# Patient Record
Sex: Female | Born: 1980 | Race: Black or African American | Hispanic: No | Marital: Single | Smoking: Never smoker
Health system: Southern US, Community
[De-identification: ages and names within clinical notes are randomized; demographics above are authoritative.]

## PROBLEM LIST (undated history)

## (undated) DIAGNOSIS — E669 Obesity, unspecified: Secondary | ICD-10-CM

## (undated) HISTORY — PX: CHOLECYSTECTOMY: SHX55

## (undated) HISTORY — PX: APPENDECTOMY: SHX54

## (undated) HISTORY — DX: Obesity, unspecified: E66.9

---

## 1998-12-18 ENCOUNTER — Ambulatory Visit (HOSPITAL_BASED_OUTPATIENT_CLINIC_OR_DEPARTMENT_OTHER): Admission: RE | Admit: 1998-12-18 | Discharge: 1998-12-18 | Payer: Self-pay | Admitting: General Surgery

## 1998-12-18 ENCOUNTER — Encounter (INDEPENDENT_AMBULATORY_CARE_PROVIDER_SITE_OTHER): Payer: Self-pay | Admitting: *Deleted

## 2004-06-10 ENCOUNTER — Other Ambulatory Visit: Admission: RE | Admit: 2004-06-10 | Discharge: 2004-06-10 | Payer: Self-pay | Admitting: Obstetrics and Gynecology

## 2004-11-02 ENCOUNTER — Inpatient Hospital Stay (HOSPITAL_COMMUNITY): Admission: AD | Admit: 2004-11-02 | Discharge: 2004-11-05 | Payer: Self-pay | Admitting: Obstetrics and Gynecology

## 2007-03-30 ENCOUNTER — Ambulatory Visit: Payer: Self-pay | Admitting: Internal Medicine

## 2007-04-05 ENCOUNTER — Ambulatory Visit: Payer: Self-pay | Admitting: *Deleted

## 2007-05-25 ENCOUNTER — Ambulatory Visit: Payer: Self-pay | Admitting: Internal Medicine

## 2007-05-28 ENCOUNTER — Ambulatory Visit: Payer: Self-pay | Admitting: Internal Medicine

## 2007-05-28 LAB — CONVERTED CEMR LAB: Preg, Serum: NEGATIVE

## 2007-05-29 ENCOUNTER — Ambulatory Visit: Payer: Self-pay | Admitting: Internal Medicine

## 2007-08-20 ENCOUNTER — Ambulatory Visit: Payer: Self-pay | Admitting: Internal Medicine

## 2007-11-12 ENCOUNTER — Ambulatory Visit: Payer: Self-pay | Admitting: Internal Medicine

## 2007-12-23 ENCOUNTER — Emergency Department (HOSPITAL_COMMUNITY): Admission: EM | Admit: 2007-12-23 | Discharge: 2007-12-23 | Payer: Self-pay | Admitting: Emergency Medicine

## 2008-02-04 ENCOUNTER — Ambulatory Visit: Payer: Self-pay | Admitting: Internal Medicine

## 2008-03-21 ENCOUNTER — Ambulatory Visit: Payer: Self-pay | Admitting: Internal Medicine

## 2008-03-21 ENCOUNTER — Encounter (INDEPENDENT_AMBULATORY_CARE_PROVIDER_SITE_OTHER): Payer: Self-pay | Admitting: Internal Medicine

## 2008-03-21 LAB — CONVERTED CEMR LAB
ALT: 32 units/L (ref 0–35)
AST: 34 units/L (ref 0–37)
Band Neutrophils: 0 % (ref 0–10)
CO2: 22 meq/L (ref 19–32)
Chlamydia, DNA Probe: NEGATIVE
Chloride: 103 meq/L (ref 96–112)
Creatinine, Ser: 0.69 mg/dL (ref 0.40–1.20)
Eosinophils Absolute: 0.1 10*3/uL (ref 0.0–0.7)
GC Probe Amp, Genital: NEGATIVE
Hemoglobin: 12.9 g/dL (ref 12.0–15.0)
Lymphs Abs: 3.1 10*3/uL (ref 0.7–4.0)
MCHC: 30.1 g/dL (ref 30.0–36.0)
Monocytes Relative: 5 % (ref 3–12)
Neutro Abs: 6.6 10*3/uL (ref 1.7–7.7)
Neutrophils Relative %: 64 % (ref 43–77)
RBC: 4.92 M/uL (ref 3.87–5.11)
Sodium: 138 meq/L (ref 135–145)
TSH: 0.919 microintl units/mL (ref 0.350–4.500)
Total Bilirubin: 0.3 mg/dL (ref 0.3–1.2)
Total CHOL/HDL Ratio: 4.7
Total Protein: 8.4 g/dL — ABNORMAL HIGH (ref 6.0–8.3)
VLDL: 14 mg/dL (ref 0–40)
WBC: 10.3 10*3/uL (ref 4.0–10.5)

## 2008-03-26 ENCOUNTER — Ambulatory Visit (HOSPITAL_COMMUNITY): Admission: RE | Admit: 2008-03-26 | Discharge: 2008-03-26 | Payer: Self-pay | Admitting: Internal Medicine

## 2008-04-08 ENCOUNTER — Encounter (INDEPENDENT_AMBULATORY_CARE_PROVIDER_SITE_OTHER): Payer: Self-pay | Admitting: *Deleted

## 2008-04-08 ENCOUNTER — Ambulatory Visit (HOSPITAL_COMMUNITY): Admission: RE | Admit: 2008-04-08 | Discharge: 2008-04-08 | Payer: Self-pay | Admitting: Internal Medicine

## 2008-04-14 ENCOUNTER — Ambulatory Visit: Payer: Self-pay | Admitting: Internal Medicine

## 2008-05-26 ENCOUNTER — Ambulatory Visit: Payer: Self-pay | Admitting: Internal Medicine

## 2008-05-28 ENCOUNTER — Ambulatory Visit: Payer: Self-pay | Admitting: Internal Medicine

## 2008-06-04 ENCOUNTER — Ambulatory Visit: Payer: Self-pay | Admitting: Internal Medicine

## 2008-09-16 ENCOUNTER — Ambulatory Visit: Payer: Self-pay | Admitting: Internal Medicine

## 2008-09-17 ENCOUNTER — Encounter (INDEPENDENT_AMBULATORY_CARE_PROVIDER_SITE_OTHER): Payer: Self-pay | Admitting: Internal Medicine

## 2008-10-16 ENCOUNTER — Ambulatory Visit (HOSPITAL_COMMUNITY): Admission: RE | Admit: 2008-10-16 | Discharge: 2008-10-16 | Payer: Self-pay | Admitting: Internal Medicine

## 2009-08-07 ENCOUNTER — Ambulatory Visit (HOSPITAL_COMMUNITY): Admission: RE | Admit: 2009-08-07 | Discharge: 2009-08-08 | Payer: Self-pay | Admitting: General Surgery

## 2009-08-10 ENCOUNTER — Encounter (INDEPENDENT_AMBULATORY_CARE_PROVIDER_SITE_OTHER): Payer: Self-pay | Admitting: General Surgery

## 2010-02-07 ENCOUNTER — Encounter: Payer: Self-pay | Admitting: Internal Medicine

## 2010-04-03 LAB — DIFFERENTIAL
Eosinophils Relative: 1 % (ref 0–5)
Lymphocytes Relative: 29 % (ref 12–46)
Lymphs Abs: 3.3 10*3/uL (ref 0.7–4.0)
Monocytes Absolute: 0.6 10*3/uL (ref 0.1–1.0)
Monocytes Relative: 5 % (ref 3–12)

## 2010-04-03 LAB — COMPREHENSIVE METABOLIC PANEL
AST: 31 U/L (ref 0–37)
Albumin: 3.8 g/dL (ref 3.5–5.2)
Calcium: 9 mg/dL (ref 8.4–10.5)
Creatinine, Ser: 0.55 mg/dL (ref 0.4–1.2)
GFR calc Af Amer: >60 mL/min (ref >60)
GFR calc non Af Amer: >60 mL/min (ref >60)
Sodium: 136 mEq/L (ref 135–145)
Total Protein: 7.7 g/dL (ref 6.0–8.3)

## 2010-04-03 LAB — CBC
MCH: 28 pg (ref 26.0–34.0)
MCHC: 32.7 g/dL (ref 30.0–36.0)
Platelets: 293 10*3/uL (ref 150–400)
RDW: 14.2 % (ref 11.5–15.5)

## 2010-04-03 LAB — HCG, SERUM, QUALITATIVE: Preg, Serum: NEGATIVE

## 2010-06-04 NOTE — Discharge Summary (Signed)
NAMEASHLA, Misty Marks           ACCOUNT NO.:  [id number]   MEDICAL RECORD NO.:  [id number]          PATIENT TYPE:  INP   LOCATION:  9132                          FACILITY:  [facility]   PHYSICIAN:  Huel Cote, M.D. DATE OF BIRTH:  1980-10-03   DATE OF ADMISSION:  11/02/2004  DATE OF DISCHARGE:  11/05/2004                                 DISCHARGE SUMMARY   DISCHARGE DIAGNOSES:  1.  Post term pregnancy at 41 weeks, delivered.  2.  Oligohydramnios.  3.  Status post normal spontaneous vaginal delivery.   DISCHARGE MEDICATIONS:  1.  Motrin 600 mg every 6 hours.  2.  Prenatal vitamins one p.o. daily.   FOLLOW UP:  The patient is to follow up in 6 weeks for routine postpartum  exam.   HISTORY OF PRESENT ILLNESS:  The patient is a 30 year old, G1, P0 who was  admitted to 62 weeks' gestation for induction of labor due to a finding of  oligohydramnios with an AFI of 5.1 on her ultrasound.  She also had a stress  test which was reassuring, but nonreactive and for this reason was felt  safer to proceed with delivery.   PRENATAL COURSE:  Her prenatal care was uneventful except for a positive  Group B Streptococcus status.   PRENATAL LABORATORY DATA:  O positive, antibody negative, RPR nonreactive,  rubella equivocal.  Hepatitis B surface antigen negative, HIV negative, GC  and chlamydia negative.  One-hour Glucola 114 and Group B Streptococcus  positive.   PAST OBSTETRICAL HISTORY:  None.   PAST SURGICAL HISTORY:  1.  Appendectomy.  2.  Right breast biopsy.   PAST MEDICAL HISTORY:  None.   MEDICATIONS:  None.   ALLERGIES:  No known drug allergies.   HOSPITAL COURSE:  Vital signs on admission, she was afebrile with stable  vital signs.  Fetal heart rate was reassuring with some accelerations and  she was placed on Pitocin.  She slowly progressed and reached approximately  1+, 70 and -2 station with vertex presentation.  At that point, it was noted  that she was having some  decelerations which appeared to have slightly late  onset, but overall variability was reassuring.  At this point, she had  rupture of membranes performed and a fetal scalp electrode was placed.  There is no essentially no fluid noted at that point.  She was also on  penicillin for her positive Group B Streptococcus status.  She continued to  progress and had an IUPC placed with amnioinfusion for some moderate  variable decelerations, however, these did improve significantly after the  amnioinfusion.  She then began to progress more rapidly and reached complete  dilation pushing for approximately 20 minutes with a normal spontaneous  vaginal delivery of a vigorous female infant over an intact perineum.  Apgars  were 8 and 9, weight 6 pounds 2 ounces. There was a true knot noted in the  cord.  Placenta delivered spontaneously with trailing membranes removed from  the cervix after delivery of the placenta.  Estimated blood loss was 350 mL.  Cervix, rectum and vagina were all intact.  She  was then admitted for  routine postpartum care and did very well.  On postpartum day #2, she was  afebrile with stable vital signs and feeling quite well.  She was tolerating  her pain with Motrin.  Her postpartum hemoglobin was 11 and she was felt  stable for discharge with medications and follow-up as previously stated.      Huel Cote, M.D.  Electronically Signed     KR/MEDQ  D:  11/05/2004  T:  11/05/2004  Job:  [id number]

## 2012-09-15 ENCOUNTER — Emergency Department (INDEPENDENT_AMBULATORY_CARE_PROVIDER_SITE_OTHER): Payer: BC Managed Care – PPO

## 2012-09-15 ENCOUNTER — Encounter (HOSPITAL_COMMUNITY): Payer: Self-pay

## 2012-09-15 ENCOUNTER — Emergency Department (HOSPITAL_COMMUNITY)
Admission: EM | Admit: 2012-09-15 | Discharge: 2012-09-15 | Disposition: A | Discharge status: Home / Self Care | Payer: BC Managed Care – PPO | Source: Home / Self Care | Attending: Emergency Medicine | Admitting: Emergency Medicine

## 2012-09-15 DIAGNOSIS — M722 Plantar fascial fibromatosis: Secondary | ICD-10-CM

## 2012-09-15 MED ORDER — HYDROCODONE-ACETAMINOPHEN 5-325 MG PO TABS: 1.0000 | ORAL_TABLET | ORAL | Status: DC | PRN

## 2012-09-15 MED ORDER — IBUPROFEN 600 MG PO TABS: 600.0000 mg | ORAL_TABLET | Freq: Three times a day (TID) | ORAL | Status: DC

## 2012-09-15 NOTE — ED Provider Notes (Signed)
CSN: [id number]     Arrival date & time 09/15/12  1501 History   First MD Initiated Contact with Patient 09/15/12 1608     No chief complaint on file.  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 No past surgical history on file. No family history on file. History  Substance Use Topics  . Smoking status: Not on file  . Smokeless tobacco: Not on file  . Alcohol Use: Not on file   OB History   No data available     Review of Systems  Constitutional: Negative.   HENT: Negative.   Eyes: Negative.   Respiratory: Negative.   Cardiovascular: Negative.   Gastrointestinal: Negative.   Endocrine: Negative.   Genitourinary: Negative.   Musculoskeletal: Positive for gait problem.       C/O LEFT FOOT PAIN  Neurological: Negative.   Hematological: Negative.   Psychiatric/Behavioral: Negative.   All other systems reviewed and are negative.    Allergies  Review of patient's allergies indicates not on file.  Home Medications   Current Outpatient Rx  Name  Route  Sig  Dispense  Refill  . HYDROcodone-acetaminophen (NORCO/VICODIN) 5-325 MG per tablet   Oral   Take 1 tablet by mouth every 4 (four) hours as needed for pain.   15 tablet   0   . ibuprofen (ADVIL,MOTRIN) 600 MG tablet   Oral   Take 1 tablet (600 mg total) by mouth 3 (three) times daily after meals.   30 tablet   0    BP 121/86  Pulse 84  Temp(Src) 99 F (37.2 C) (Oral)  Resp 18  SpO2 100% Physical Exam  Nursing note and vitals reviewed. Constitutional: She is oriented to person, place, and time. She appears well-developed and well-nourished.  HENT:  Head: Normocephalic and atraumatic.  Mouth/Throat: Oropharynx is clear and moist.  Eyes: Conjunctivae are normal. Pupils are equal, round, and reactive to light.   Neck: Normal range of motion. Neck supple.  Cardiovascular: Normal rate, regular rhythm, normal heart sounds and intact distal pulses.   No murmur heard. Pulmonary/Chest: Effort normal and breath sounds normal.  Abdominal: Soft. Bowel sounds are normal. She exhibits no distension and no mass. There is no tenderness.  Musculoskeletal: Normal range of motion.  LEFT FOOT=NO SWELLING,ERYTHEMA OR DEFORMITY,SLIGHT TENDER ON DEEP PALPATION  Neurological: She is alert and oriented to person, place, and time. No cranial nerve deficit. She exhibits normal muscle tone. Coordination normal.  Skin: Skin is warm and dry.  Psychiatric: She has a normal mood and affect.    ED Course  Procedures (including critical care time) Labs Review Labs Reviewed - No data to display Imaging Review Dg Foot Complete Left  09/15/2012   *RADIOLOGY REPORT*  Clinical Data: History of trauma complaining of left foot pain.  LEFT FOOT - COMPLETE 3+ VIEW  Comparison: No priors.  Findings: Three views of the left foot demonstrate no acute displaced fracture, subluxation, dislocation, joint or soft tissue abnormality.  IMPRESSION: No acute radiographic abnormality of the left foot.   Original Report Authenticated By: Trudie Reed, M.D.    MDM   1. Plantar fasciitis of left foot      Duwayne Heck de Marcello Moores, MD 09/15/12 [phone]

## 2012-09-15 NOTE — ED Notes (Signed)
Seen by MD only

## 2012-09-20 ENCOUNTER — Ambulatory Visit (HOSPITAL_COMMUNITY)
Admission: RE | Admit: 2012-09-20 | Discharge: 2012-09-20 | Disposition: A | Discharge status: Home / Self Care | Payer: BC Managed Care – PPO | Source: Ambulatory Visit | Attending: Internal Medicine | Admitting: Internal Medicine

## 2012-09-20 ENCOUNTER — Other Ambulatory Visit (HOSPITAL_COMMUNITY): Payer: Self-pay | Admitting: Internal Medicine

## 2012-09-20 DIAGNOSIS — M79609 Pain in unspecified limb: Secondary | ICD-10-CM

## 2012-09-20 DIAGNOSIS — M25562 Pain in left knee: Secondary | ICD-10-CM

## 2012-09-20 DIAGNOSIS — M7989 Other specified soft tissue disorders: Secondary | ICD-10-CM

## 2012-09-20 DIAGNOSIS — M25569 Pain in unspecified knee: Secondary | ICD-10-CM | POA: Insufficient documentation

## 2012-09-20 NOTE — Progress Notes (Signed)
Left lower extremity venous duplex completed.  Left:  No evidence of DVT, superficial thrombosis, or Baker's cyst.  Right:  Negative for DVT in the common femoral vein.  

## 2013-09-25 ENCOUNTER — Ambulatory Visit (INDEPENDENT_AMBULATORY_CARE_PROVIDER_SITE_OTHER): Payer: BC Managed Care – PPO | Admitting: Physician Assistant

## 2013-09-25 VITALS — BP 124/76 | HR 97 | Temp 98.2°F | Resp 20 | Ht 62.25 in | Wt 238.4 lb

## 2013-09-25 DIAGNOSIS — J029 Acute pharyngitis, unspecified: Secondary | ICD-10-CM

## 2013-09-25 DIAGNOSIS — E669 Obesity, unspecified: Secondary | ICD-10-CM | POA: Insufficient documentation

## 2013-09-25 LAB — POCT RAPID STREP A (OFFICE): Rapid Strep A Screen: NEGATIVE

## 2013-09-25 MED ORDER — GUAIFENESIN ER 1200 MG PO TB12: 1.0000 | ORAL_TABLET | Freq: Two times a day (BID) | ORAL | Status: DC | PRN

## 2013-09-25 MED ORDER — BENZONATATE 100 MG PO CAPS: 100.0000 mg | ORAL_CAPSULE | Freq: Three times a day (TID) | ORAL | Status: DC | PRN

## 2013-09-25 MED ORDER — IPRATROPIUM BROMIDE 0.03 % NA SOLN: 2.0000 | Freq: Two times a day (BID) | NASAL | Status: DC

## 2013-09-25 NOTE — Progress Notes (Signed)
Subjective:    Patient ID: Misty Marks, female    DOB: 27-Jul-1980, 33 y.o.   MRN: [id number]   PCP: Misty Fick, MD  Chief Complaint  Patient presents with  . Sore Throat    today   . Generalized Body Aches    yesterday   . Cough  . Nausea      Active Ambulatory Problems    Diagnosis Date Noted  . Obesity    Resolved Ambulatory Problems    Diagnosis Date Noted  . No Resolved Ambulatory Problems   No Additional Past Medical History    Past Surgical History  Procedure Laterality Date  . Cholecystectomy    . Appendectomy      No Known Allergies  Prior to Admission medications   Medication Sig Start Date End Date Taking? Authorizing Provider  Norgestim-Eth Estrad Triphasic (ORTHO TRI-CYCLEN, 28, PO) Take by mouth.   Yes Historical Provider, MD    History   Social History  . Marital Status: Single    Spouse Name: n/a    Number of Children: 1  . Years of Education: College+   Occupational History  . IT     [employer]   Social History Main Topics  . Smoking status: Never Smoker   . Smokeless tobacco: Never Used  . Alcohol Use: No  . Drug Use: No  . Sexual Activity: Not Currently    Partners: Male    Birth Control/ Protection: Pill, Abstinence   Other Topics Concern  . None   Social History Narrative   Lives with her son, Misty Marks.    family history includes Hypertension in her mother. indicated that her mother is alive. She indicated that her father is alive. She indicated that all of her four sisters are alive. She indicated that both of her brothers are alive. She indicated that her son is alive.   HPI  This 33 y.o. female presents for evaluation of acute illness. Symptoms began yesterday with achiness, cough, nausea.   Sore throat began this morning. Subjective fever. Diarrhea a little yesterday. She's exposed to lots of germs, working in IT at [employer]. [employer] are the dirtiest things ever." Her 22 year-old son, who accompanies her  today, has a cold.  Review of Systems As above.    Objective:   Physical Exam  Vitals reviewed. Constitutional: She is oriented to person, place, and time. Vital signs are normal. She appears well-developed and well-nourished. No distress.  HENT:  Head: Normocephalic and atraumatic.  Right Ear: Hearing, tympanic membrane, external ear and ear canal normal.  Left Ear: Hearing, tympanic membrane, external ear and ear canal normal.  Nose: Mucosal edema and rhinorrhea present.  No foreign bodies. Right sinus exhibits no maxillary sinus tenderness and no frontal sinus tenderness. Left sinus exhibits no maxillary sinus tenderness and no frontal sinus tenderness.  Mouth/Throat: Uvula is midline, oropharynx is clear and moist and mucous membranes are normal. No uvula swelling. No oropharyngeal exudate.  Eyes: Conjunctivae and EOM are normal. Pupils are equal, round, and reactive to light. Right eye exhibits no discharge. Left eye exhibits no discharge. No scleral icterus.  Neck: Trachea normal, normal range of motion and full passive range of motion without pain. Neck supple. No mass and no thyromegaly present.  Cardiovascular: Normal rate, regular rhythm and normal heart sounds.   Pulmonary/Chest: Effort normal and breath sounds normal.  Lymphadenopathy:       Head (right side): No submandibular, no tonsillar, no preauricular, no posterior auricular and no  occipital adenopathy present.       Head (left side): No submandibular, no tonsillar, no preauricular and no occipital adenopathy present.    She has no cervical adenopathy.       Right: No supraclavicular adenopathy present.       Left: No supraclavicular adenopathy present.  Neurological: She is alert and oriented to person, place, and time. She has normal strength. No cranial nerve deficit or sensory deficit.  Skin: Skin is warm, dry and intact. No rash noted.  Psychiatric: She has a normal mood and affect. Her speech is normal and behavior  is normal.      Results for orders placed in visit on 09/25/13  POCT RAPID STREP A (OFFICE)      Result Value Ref Range   Rapid Strep A Screen Negative  Negative       Assessment & Plan:  1. Acute pharyngitis, unspecified pharyngitis type Likely viral illness.  Anticipatory guidance.  Supportive care. Await TC. - POCT rapid strep A - Culture, Group A Strep - benzonatate (TESSALON) 100 MG capsule; Take 1-2 capsules (100-200 mg total) by mouth 3 (three) times daily as needed for cough.  Dispense: 40 capsule; Refill: 0 - ipratropium (ATROVENT) 0.03 % nasal spray; Place 2 sprays into both nostrils 2 (two) times daily.  Dispense: 30 mL; Refill: 0 - Guaifenesin (MUCINEX MAXIMUM STRENGTH) 1200 MG TB12; Take 1 tablet (1,200 mg total) by mouth every 12 (twelve) hours as needed.  Dispense: 14 tablet; Refill: 1   Misty Bras, PA-C Physician Assistant-Certified [facility]

## 2013-09-25 NOTE — Patient Instructions (Signed)
Get plenty of rest and drink at least 64 ounces of water daily. You can expect your illness to last 7 days, with days 3-5 days being the worst. Use ibuprofen and/or acetaminophen as needed for fever, muscle aches and sore throat.

## 2013-09-28 LAB — CULTURE, GROUP A STREP: Organism ID, Bacteria: NORMAL

## 2014-05-15 ENCOUNTER — Ambulatory Visit (INDEPENDENT_AMBULATORY_CARE_PROVIDER_SITE_OTHER): Payer: BC Managed Care – PPO | Admitting: Emergency Medicine

## 2014-05-15 VITALS — BP 120/80 | HR 82 | Temp 98.0°F | Resp 16 | Ht 64.0 in | Wt 228.0 lb

## 2014-05-15 DIAGNOSIS — M5489 Other dorsalgia: Secondary | ICD-10-CM | POA: Diagnosis not present

## 2014-05-15 DIAGNOSIS — M545 Low back pain, unspecified: Secondary | ICD-10-CM

## 2014-05-15 DIAGNOSIS — R197 Diarrhea, unspecified: Secondary | ICD-10-CM

## 2014-05-15 LAB — POCT CBC
GRANULOCYTE PERCENT: 69.9 % (ref 37–80)
HEMATOCRIT: 37.4 % — AB (ref 37.7–47.9)
Hemoglobin: 11.8 g/dL — AB (ref 12.2–16.2)
Lymph, poc: 4.1 — AB (ref 0.6–3.4)
MCH: 26.7 pg — AB (ref 27–31.2)
MCHC: 31.4 g/dL — AB (ref 31.8–35.4)
MCV: 85.1 fL (ref 80–97)
MID (cbc): 0.7 (ref 0–0.9)
MPV: 7 fL (ref 0–99.8)
POC Granulocyte: 11 — AB (ref 2–6.9)
POC LYMPH %: 25.7 % (ref 10–50)
POC MID %: 4.4 %M (ref 0–12)
Platelet Count, POC: 371 10*3/uL (ref 142–424)
RBC: 4.4 M/uL (ref 4.04–5.48)
RDW, POC: 14.9 %
WBC: 15.8 10*3/uL — AB (ref 4.6–10.2)

## 2014-05-15 MED ORDER — NAPROXEN SODIUM 550 MG PO TABS: 550.0000 mg | ORAL_TABLET | Freq: Two times a day (BID) | ORAL | Status: AC

## 2014-05-15 MED ORDER — CYCLOBENZAPRINE HCL 10 MG PO TABS: 10.0000 mg | ORAL_TABLET | Freq: Three times a day (TID) | ORAL | Status: DC | PRN

## 2014-05-15 NOTE — Progress Notes (Signed)
[facility] [address], [place] [address] [phone]  Date:  05/15/2014   Name:  Misty BollmanSherrianita Reifschneider   DOB:  09/01/1980   MRN:  [id number]  PCP:  Georgianne FickAMACHANDRAN,AJITH, MD    Chief Complaint: Chills and Diarrhea   History of Present Illness:  Misty BollmanSherrianita Cossey is a 34 y.o. very pleasant female patient who presents with the following:  Treated with an antibiotic two weeks ago and last week after finishing the medication she developed diarrhea Has frequent loose stools Has mucous in her stools.  No blood or pus. No nausea or vomiting No fever or chills but feels hot and cold. No abdominal discomfort No ill contacts Says she crawled under her desk at work and developed pain in lower back radiating into the right leg.  Some weakness in left leg Needs assistance getting off the floor. No history of HNP or back pain No improvement with over the counter medications or other home remedies.  Denies other complaint or health concern today.   Patient Active Problem List   Diagnosis Date Noted  . Obesity     Past Medical History  Diagnosis Date  . Obesity     Past Surgical History  Procedure Laterality Date  . Cholecystectomy    . Appendectomy      History  Substance Use Topics  . Smoking status: Never Smoker   . Smokeless tobacco: Never Used  . Alcohol Use: No    Family History  Problem Relation Age of Onset  . Hypertension Mother     No Known Allergies  Medication list has been reviewed and updated.  Current Outpatient Prescriptions on File Prior to Visit  Medication Sig Dispense Refill  . ipratropium (ATROVENT) 0.03 % nasal spray Place 2 sprays into both nostrils 2 (two) times daily. (Patient not taking: Reported on 05/15/2014) 30 mL 0  . Norgestim-Eth Estrad Triphasic (ORTHO TRI-CYCLEN, 28, PO) Take by mouth.     No current facility-administered medications on file prior to visit.    ROS   As per HPI, otherwise negative.     Physical Exam  GEN: morbid obesity, NAD, Non-toxic, A & O x 3 HEENT: Atraumatic, Normocephalic. Neck supple. No masses, No LAD. Ears and Nose: No external deformity. CV: RRR, No M/G/R. No JVD. No thrill. No extra heart sounds. PULM: CTA B, no wheezes, crackles, rhonchi. No retractions. No resp. distress. No accessory muscle use. ABD: S, NT, ND, +BS. No rebound. No HSM. EXTR: No c/c/e NEURO Normal gait.  PSYCH: Normally interactive. Conversant. Not depressed or anxious appearing.  Calm demeanor.   Assessment and Plan: Diarrhea  Must exclude C. Dif in view of antibiotic treatment Yogurt  C.dif Sciatic neuritis Anaprox Flexeril Local heat  Signed,  Phillips OdorJeffery Anderson, MD

## 2014-05-15 NOTE — Patient Instructions (Signed)
Clostridium Difficile Infection °Clostridium difficile (C. difficile) is a bacteria found in the intestinal tract or colon. Under certain conditions, it causes diarrhea and sometimes severe disease. The severe form of the disease is known as pseudomembranous colitis (often called C. difficile colitis). This disease can damage the lining of the colon or cause the colon to become enlarged (toxic megacolon). °CAUSES °Your colon normally contains many different bacteria, including C. difficile. The balance of bacteria in your colon can change during illness. This is especially true when you take antibiotic medicine. Taking antibiotics may allow the C. difficile to grow, multiply excessively, and make a toxin that then causes illness. The elderly and people with certain medical conditions have a greater risk of getting C. difficile infections. °SYMPTOMS °· Watery diarrhea. °· Fever. °· Fatigue. °· Loss of appetite. °· Nausea. °· Abdominal swelling, pain, or tenderness. °· Dehydration. °DIAGNOSIS °Your symptoms may make your caregiver suspect a C. difficile infection, especially if you have used antibiotics in the preceding weeks. However, there are only 2 ways to know for certain whether you have a C. difficile infection: °· A lab test that finds the toxin in your stool. °· The specific appearance of an abnormality (pseudomembrane) in your colon. This can only be seen by doing a sigmoidoscopy or colonoscopy. These procedures involve passing an instrument through your rectum to look at the inside of your colon. °Your caregiver will help determine if these tests are necessary. °TREATMENT °· Most people are successfully treated with one of two specific antibiotics, usually given by mouth. Other antibiotics you are receiving are stopped if possible. °· Intravenous (IV) fluids and correction of electrolyte imbalance may be necessary. °· Rarely, surgery may be needed to remove the infected part of the intestines. °· Careful  hand washing by you and your caregivers is important to prevent the spread of infection. In the hospital, your caregivers may also put on gowns and gloves to prevent the spread of the C. difficile bacteria. Your room is also cleaned regularly with a solution containing bleach or a product that is known to kill C. difficile. °HOME CARE INSTRUCTIONS °· Drink enough fluids to keep your urine clear or pale yellow. Avoid milk, caffeine, and alcohol. °· Ask your caregiver for specific rehydration instructions. °· Try eating small, frequent meals rather than large meals. °· Take your antibiotics as directed. Finish them even if you start to feel better. °· Do not use medicines to slow diarrhea. This could delay healing or cause complications. °· Wash your hands thoroughly after using the bathroom and before preparing food. °· Make sure people who live with you wash their hands often, too. °· Carefully disinfect all surfaces with a product that contains chlorine bleach. °SEEK MEDICAL CARE IF: °· Diarrhea persists longer than expected or recurs after completing your course of antibiotic treatment for the C. difficile infection. °· You have trouble staying hydrated. °SEEK IMMEDIATE MEDICAL CARE IF: °· You develop a new fever. °· You have increasing abdominal pain or tenderness. °· There is blood in your stools, or your stools are dark black and tarry. °· You cannot hold down food or liquids. °MAKE SURE YOU: °· Understand these instructions. °· Will watch your condition. °· Will get help right away if you are not doing well or get worse. °Document Released: 10/13/2004 Document Revised: 05/20/2013 Document Reviewed: 06/11/2010 °[employer]® Patient Information ©2015 [employer]. This information is not intended to replace advice given to you by your health care provider. Make sure you   discuss any questions you have with your health care provider. ° °

## 2014-05-19 ENCOUNTER — Other Ambulatory Visit: Payer: Self-pay | Admitting: Emergency Medicine

## 2014-05-19 LAB — C. DIFFICILE GDH AND TOXIN A/B
C. DIFFICILE GDH: NOT DETECTED
C. difficile Toxin A/B: NOT DETECTED

## 2014-06-28 ENCOUNTER — Emergency Department (HOSPITAL_COMMUNITY): Payer: BC Managed Care – PPO

## 2014-06-28 ENCOUNTER — Encounter (HOSPITAL_COMMUNITY): Payer: Self-pay | Admitting: Emergency Medicine

## 2014-06-28 ENCOUNTER — Emergency Department (HOSPITAL_COMMUNITY)
Admission: EM | Admit: 2014-06-28 | Discharge: 2014-06-28 | Disposition: A | Discharge status: Home / Self Care | Payer: BC Managed Care – PPO | Attending: Emergency Medicine | Admitting: Emergency Medicine

## 2014-06-28 DIAGNOSIS — S60131A Contusion of right middle finger with damage to nail, initial encounter: Secondary | ICD-10-CM | POA: Insufficient documentation

## 2014-06-28 DIAGNOSIS — Z791 Long term (current) use of non-steroidal anti-inflammatories (NSAID): Secondary | ICD-10-CM | POA: Diagnosis not present

## 2014-06-28 DIAGNOSIS — S6992XA Unspecified injury of left wrist, hand and finger(s), initial encounter: Secondary | ICD-10-CM | POA: Diagnosis present

## 2014-06-28 DIAGNOSIS — S62663B Nondisplaced fracture of distal phalanx of left middle finger, initial encounter for open fracture: Secondary | ICD-10-CM

## 2014-06-28 DIAGNOSIS — S62661B Nondisplaced fracture of distal phalanx of left index finger, initial encounter for open fracture: Secondary | ICD-10-CM | POA: Diagnosis not present

## 2014-06-28 DIAGNOSIS — Y998 Other external cause status: Secondary | ICD-10-CM | POA: Diagnosis not present

## 2014-06-28 DIAGNOSIS — Y92810 Car as the place of occurrence of the external cause: Secondary | ICD-10-CM | POA: Insufficient documentation

## 2014-06-28 DIAGNOSIS — Y9389 Activity, other specified: Secondary | ICD-10-CM | POA: Insufficient documentation

## 2014-06-28 DIAGNOSIS — Z23 Encounter for immunization: Secondary | ICD-10-CM | POA: Insufficient documentation

## 2014-06-28 DIAGNOSIS — W231XXA Caught, crushed, jammed, or pinched between stationary objects, initial encounter: Secondary | ICD-10-CM | POA: Insufficient documentation

## 2014-06-28 DIAGNOSIS — E669 Obesity, unspecified: Secondary | ICD-10-CM | POA: Insufficient documentation

## 2014-06-28 DIAGNOSIS — Z79899 Other long term (current) drug therapy: Secondary | ICD-10-CM | POA: Diagnosis not present

## 2014-06-28 MED ORDER — FENTANYL CITRATE (PF) 100 MCG/2ML IJ SOLN
100.0000 ug | Freq: Once | INTRAMUSCULAR | Status: AC
  Administered 2014-06-28: 100 ug via NASAL
  Filled 2014-06-28: qty 2

## 2014-06-28 MED ORDER — LIDOCAINE HCL 2 % IJ SOLN
20.0000 mL | Freq: Once | INTRAMUSCULAR | Status: DC
  Filled 2014-06-28: qty 20

## 2014-06-28 MED ORDER — OXYCODONE-ACETAMINOPHEN 5-325 MG PO TABS
2.0000 | ORAL_TABLET | Freq: Once | ORAL | Status: AC
  Administered 2014-06-28: 2 via ORAL
  Filled 2014-06-28: qty 2

## 2014-06-28 MED ORDER — LIDOCAINE HCL (PF) 1 % IJ SOLN: 5.0000 mL | Freq: Once | INTRAMUSCULAR | Status: DC

## 2014-06-28 MED ORDER — TETANUS-DIPHTH-ACELL PERTUSSIS 5-2.5-18.5 LF-MCG/0.5 IM SUSP
0.5000 mL | Freq: Once | INTRAMUSCULAR | Status: AC
  Administered 2014-06-28: 0.5 mL via INTRAMUSCULAR
  Filled 2014-06-28: qty 0.5

## 2014-06-28 MED ORDER — OXYCODONE-ACETAMINOPHEN 5-325 MG PO TABS: 1.0000 | ORAL_TABLET | Freq: Four times a day (QID) | ORAL | Status: DC | PRN

## 2014-06-28 NOTE — Discharge Instructions (Signed)
Take percocet for severe pain only. No driving or operating heavy machinery while taking percocet. This medication may cause drowsiness.  Finger Fracture Fractures of fingers are breaks in the bones of the fingers. There are many types of fractures. There are different ways of treating these fractures. Your health care provider will discuss the best way to treat your fracture. CAUSES Traumatic injury is the main cause of broken fingers. These include:  Injuries while playing sports.  Workplace injuries.  Falls. RISK FACTORS Activities that can increase your risk of finger fractures include:  Sports.  Workplace activities that involve machinery.  A condition called osteoporosis, which can make your bones less dense and cause them to fracture more easily. SIGNS AND SYMPTOMS The main symptoms of a broken finger are pain and swelling within 15 minutes after the injury. Other symptoms include:  Bruising of your finger.  Stiffness of your finger.  Numbness of your finger.  Exposed bones (compound fracture) if the fracture is severe. DIAGNOSIS  The best way to diagnose a broken bone is with X-ray imaging. Additionally, your health care provider will use this X-ray image to evaluate the position of the broken finger bones.  TREATMENT  Finger fractures can be treated with:   Nonreduction--This means the bones are in place. The finger is splinted without changing the positions of the bone pieces. The splint is usually left on for about a week to 10 days. This will depend on your fracture and what your health care provider thinks.  Closed reduction--The bones are put back into position without using surgery. The finger is then splinted.  Open reduction and internal fixation--The fracture site is opened. Then the bone pieces are fixed into place with pins or some type of hardware. This is seldom required. It depends on the severity of the fracture. HOME CARE INSTRUCTIONS   Follow your  health care provider's instructions regarding activities, exercises, and physical therapy.  Only take over-the-counter or prescription medicines for pain, discomfort, or fever as directed by your health care provider. SEEK MEDICAL CARE IF: You have pain or swelling that limits the motion or use of your fingers. SEEK IMMEDIATE MEDICAL CARE IF:  Your finger becomes numb. MAKE SURE YOU:   Understand these instructions.  Will watch your condition.  Will get help right away if you are not doing well or get worse. Document Released: 04/17/2000 Document Revised: 10/24/2012 Document Reviewed: 08/15/2012 [facility] Patient Information 2015 [place], [place]. This information is not intended to replace advice given to you by your health care provider. Make sure you discuss any questions you have with your health care provider.

## 2014-06-28 NOTE — ED Provider Notes (Signed)
CSN: [id number]     Arrival date & time 06/28/14  1812 History   This chart was scribed for Misty Skeen, PA-C working with Pricilla Loveless, MD by Elveria Rising, ED Scribe. This patient was seen in room WTR6/WTR6 and the patient's care was started at 6:20 PM.   Chief Complaint  Patient presents with  . Finger Injury   The history is provided by the patient. No language interpreter was used.   HPI Comments: Misty Marks is a 34 y.o. female who presents to the Emergency Department with crush injury incurred to right second, third and fourth fingers approximately 20 minutes ago after slamming her fingers in the car door. Patient presents with fingers bandaged; bleeding controlled. Patient reports severe pain at the distal tips of the affected fingers and pain in the palm of her hand. Pain worse with any movement or pressure. Patient reports taking an aspirin prior to arrival, which provided mild relief. Patient uncertain of her last Tetanus vaccination; in need of update.   Past Medical History  Diagnosis Date  . Obesity    Past Surgical History  Procedure Laterality Date  . Cholecystectomy    . Appendectomy     Family History  Problem Relation Age of Onset  . Hypertension Mother    History  Substance Use Topics  . Smoking status: Never Smoker   . Smokeless tobacco: Never Used  . Alcohol Use: No   OB History    No data available     Review of Systems  Constitutional: Negative for fever and chills.  Musculoskeletal: Positive for arthralgias.  Skin: Positive for wound.      Allergies  Review of patient's allergies indicates no known allergies.  Home Medications   Prior to Admission medications   Medication Sig Start Date End Date Taking? Authorizing Provider  cyclobenzaprine (FLEXERIL) 10 MG tablet Take 1 tablet (10 mg total) by mouth 3 (three) times daily as needed for muscle spasms. 05/15/14   Carmelina Dane, MD  ipratropium (ATROVENT) 0.03 % nasal spray Place 2  sprays into both nostrils 2 (two) times daily. Patient not taking: Reported on 05/15/2014 09/25/13   Porfirio Oar, PA-C  naproxen sodium (ANAPROX DS) 550 MG tablet Take 1 tablet (550 mg total) by mouth 2 (two) times daily with a meal. 05/15/14 05/15/15  Carmelina Dane, MD  Norgestim-Eth Charlott Holler Triphasic (ORTHO TRI-CYCLEN, 28, PO) Take by mouth.    Historical Provider, MD  oxyCODONE-acetaminophen (PERCOCET) 5-325 MG per tablet Take 1-2 tablets by mouth every 6 (six) hours as needed for severe pain. 06/28/14   Kathrynn Speed, PA-C   Triage Vitals: BP 137/99 mmHg  Pulse 80  Temp(Src) 98.3 F (36.8 C) (Oral)  Resp 20  SpO2 98% Physical Exam  Constitutional: She is oriented to person, place, and time. She appears well-developed and well-nourished. No distress.  HENT:  Head: Normocephalic and atraumatic.  Mouth/Throat: Oropharynx is clear and moist.  Eyes: Conjunctivae and EOM are normal.  Neck: Normal range of motion. Neck supple.  Cardiovascular: Normal rate, regular rhythm and normal heart sounds.   Pulmonary/Chest: Effort normal and breath sounds normal. No respiratory distress.  Musculoskeletal:  R hand- TTP over 2nd, 3rd and 4th fingers, moreso at distal tip. Middle finger with subungual hematoma (>50%) and small abrasion on palmar surface, edematous and bruised. ROM limited by pain. Cap refill < 2 seconds.  Neurological: She is alert and oriented to person, place, and time. No sensory deficit.  Skin: Skin is  warm and dry.  Psychiatric: She has a normal mood and affect. Her behavior is normal.  Nursing note and vitals reviewed.   ED Course  Procedures (including critical care time)  COORDINATION OF CARE: 6:27 PM- Will order pain medication. Discussed treatment plan with patient at bedside and patient agreed to plan.   Labs Review Labs Reviewed - No data to display  Imaging Review Dg Hand Complete Right  06/28/2014   CLINICAL DATA:  34 year old female with distal right finger  pain after injury) shot fingers in a car door earlier today). Laceration to the distal aspect of the right middle finger.  EXAM: RIGHT HAND - COMPLETE 3+ VIEW  COMPARISON:  No priors.  FINDINGS: Mildly comminuted nondisplaced fracture of the tuft of the third distal phalanx. No other acute displaced fracture, subluxation or dislocation is noted.  IMPRESSION: 1. Mildly comminuted nondisplaced tuft fracture of the third distal phalanx.   Electronically Signed   By: Trudie Reed M.D.   On: 06/28/2014 19:03     EKG Interpretation None      MDM   Final diagnoses:  Open nondisplaced fracture of distal phalanx of left middle finger, initial encounter    Neurovascularly intact. X-ray showing mildly comminuted nondisplaced tuft fracture of the third distal phalanx. There is an associated small puncture type laceration to the palmar distal tip and a subungual hematoma. We spoke with Dr. Janee Morn, hand surgeon on-call, who does not suggest trephination or antibiotics, and he will see the patient in follow-up. States his office will call her to schedule an appointment. The patient will also be provided with his office information. Wound care given. Tdap updated. Splint applied. Stable for d/c. Rx percocet. Return precautions given. Patient states understanding of treatment care plan and is agreeable.  Discussed with attending Dr. Criss Alvine who also evaluated patient and agrees with plan of care.  I personally performed the services described in this documentation, which was scribed in my presence. The recorded information has been reviewed and is accurate.  Kathrynn Speed, PA-C 06/28/14 1943  Pricilla Loveless, MD 06/29/14 [phone]

## 2014-06-28 NOTE — ED Notes (Signed)
Questions r/t dc denied. Pt ambulatory and a&ox4 

## 2014-06-28 NOTE — ED Notes (Signed)
Pt slammed car door onto R 3 middle fingers.

## 2015-01-06 ENCOUNTER — Emergency Department (HOSPITAL_COMMUNITY): Payer: Medicaid Other

## 2015-01-06 ENCOUNTER — Emergency Department (HOSPITAL_COMMUNITY)
Admission: EM | Admit: 2015-01-06 | Discharge: 2015-01-06 | Disposition: A | Discharge status: Home / Self Care | Payer: Medicaid Other | Attending: Emergency Medicine | Admitting: Emergency Medicine

## 2015-01-06 DIAGNOSIS — R05 Cough: Secondary | ICD-10-CM | POA: Diagnosis present

## 2015-01-06 DIAGNOSIS — J019 Acute sinusitis, unspecified: Secondary | ICD-10-CM | POA: Diagnosis not present

## 2015-01-06 DIAGNOSIS — E669 Obesity, unspecified: Secondary | ICD-10-CM | POA: Diagnosis not present

## 2015-01-06 DIAGNOSIS — Z791 Long term (current) use of non-steroidal anti-inflammatories (NSAID): Secondary | ICD-10-CM | POA: Diagnosis not present

## 2015-01-06 DIAGNOSIS — Z79899 Other long term (current) drug therapy: Secondary | ICD-10-CM | POA: Diagnosis not present

## 2015-01-06 LAB — RAPID STREP SCREEN (MED CTR MEBANE ONLY): Streptococcus, Group A Screen (Direct): NEGATIVE

## 2015-01-06 MED ORDER — AMOXICILLIN-POT CLAVULANATE 875-125 MG PO TABS: 1.0000 | ORAL_TABLET | Freq: Two times a day (BID) | ORAL | Status: DC

## 2015-01-06 MED ORDER — DEXAMETHASONE SODIUM PHOSPHATE 10 MG/ML IJ SOLN: 10.0000 mg | Freq: Once | INTRAMUSCULAR | Status: DC

## 2015-01-06 MED ORDER — DEXAMETHASONE SODIUM PHOSPHATE 10 MG/ML IJ SOLN
10.0000 mg | Freq: Once | INTRAMUSCULAR | Status: AC
  Administered 2015-01-06: 10 mg via INTRAMUSCULAR
  Filled 2015-01-06: qty 1

## 2015-01-06 NOTE — ED Notes (Signed)
Pt reports productive cough, yellow sputum since 12/11, sore throat present as well. Pt lost voice 2 days ago. Pain 6/10.

## 2015-01-06 NOTE — Discharge Instructions (Signed)
Sinusitis, Adult  Sinusitis is redness, soreness, and inflammation of the paranasal sinuses. Paranasal sinuses are air pockets within the bones of your face. They are located beneath your eyes, in the middle of your forehead, and above your eyes. In healthy paranasal sinuses, mucus is able to drain out, and air is able to circulate through them by way of your nose. However, when your paranasal sinuses are inflamed, mucus and air can become trapped. This can allow bacteria and other germs to grow and cause infection.  Sinusitis can develop quickly and last only a short time (acute) or continue over a long period (chronic). Sinusitis that lasts for more than 12 weeks is considered chronic.  CAUSES  Causes of sinusitis include:  · Allergies.  · Structural abnormalities, such as displacement of the cartilage that separates your nostrils (deviated septum), which can decrease the air flow through your nose and sinuses and affect sinus drainage.  · Functional abnormalities, such as when the small hairs (cilia) that line your sinuses and help remove mucus do not work properly or are not present.  SIGNS AND SYMPTOMS  Symptoms of acute and chronic sinusitis are the same. The primary symptoms are pain and pressure around the affected sinuses. Other symptoms include:  · Upper toothache.  · Earache.  · Headache.  · Bad breath.  · Decreased sense of smell and taste.  · A cough, which worsens when you are lying flat.  · Fatigue.  · Fever.  · Thick drainage from your nose, which often is green and may contain pus (purulent).  · Swelling and warmth over the affected sinuses.  DIAGNOSIS  Your health care provider will perform a physical exam. During your exam, your health care provider may perform any of the following to help determine if you have acute sinusitis or chronic sinusitis:  · Look in your nose for signs of abnormal growths in your nostrils (nasal polyps).  · Tap over the affected sinus to check for signs of  infection.  · View the inside of your sinuses using an imaging device that has a light attached (endoscope).  If your health care provider suspects that you have chronic sinusitis, one or more of the following tests may be recommended:  · Allergy tests.  · Nasal culture. A sample of mucus is taken from your nose, sent to a lab, and screened for bacteria.  · Nasal cytology. A sample of mucus is taken from your nose and examined by your health care provider to determine if your sinusitis is related to an allergy.  TREATMENT  Most cases of acute sinusitis are related to a viral infection and will resolve on their own within 10 days. Sometimes, medicines are prescribed to help relieve symptoms of both acute and chronic sinusitis. These may include pain medicines, decongestants, nasal steroid sprays, or saline sprays.  However, for sinusitis related to a bacterial infection, your health care provider will prescribe antibiotic medicines. These are medicines that will help kill the bacteria causing the infection.  Rarely, sinusitis is caused by a fungal infection. In these cases, your health care provider will prescribe antifungal medicine.  For some cases of chronic sinusitis, surgery is needed. Generally, these are cases in which sinusitis recurs more than 3 times per year, despite other treatments.  HOME CARE INSTRUCTIONS  · Drink plenty of water. Water helps thin the mucus so your sinuses can drain more easily.  · Use a humidifier.  · Inhale steam 3-4 times a day (for   example, sit in the bathroom with the shower running).  · Apply a warm, moist washcloth to your face 3-4 times a day, or as directed by your health care provider.  · Use saline nasal sprays to help moisten and clean your sinuses.  · Take medicines only as directed by your health care provider.  · If you were prescribed either an antibiotic or antifungal medicine, finish it all even if you start to feel better.  SEEK IMMEDIATE MEDICAL CARE IF:  · You have  increasing pain or severe headaches.  · You have nausea, vomiting, or drowsiness.  · You have swelling around your face.  · You have vision problems.  · You have a stiff neck.  · You have difficulty breathing.     This information is not intended to replace advice given to you by your health care provider. Make sure you discuss any questions you have with your health care provider.     Document Released: 01/03/2005 Document Revised: 01/24/2014 Document Reviewed: 01/18/2011  Elsevier Interactive Patient Education ©2016 [employer]  Sinus Rinse  WHAT IS A SINUS RINSE?  A sinus rinse is a simple home treatment that is used to rinse your sinuses with a sterile mixture of salt and water (saline solution). Sinuses are air-filled spaces in your skull behind the bones of your face and forehead that open into your nasal cavity.  You will use the following:  · Saline solution.  · Neti pot or spray bottle. This releases the saline solution into your nose and through your sinuses. Neti pots and spray bottles can be purchased at your local pharmacy, a health food store, or online.  WHEN WOULD I DO A SINUS RINSE?  A sinus rinse can help to clear mucus, dirt, dust, or pollen from the nasal cavity. You may do a sinus rinse when you have a cold, a virus, nasal allergy symptoms, a sinus infection, or stuffiness in the nose or sinuses.  If you are considering a sinus rinse:  · Ask your child's health care provider before performing a sinus rinse on your child.  · Do not do a sinus rinse if you have had ear or nasal surgery, ear infection, or blocked ears.  HOW DO I DO A SINUS RINSE?  · Wash your hands.  · Disinfect your device according to the directions provided and then dry it.  · Use the solution that comes with your device or one that is sold separately in stores. Follow the mixing directions on the package.  · Fill your device with the amount of saline solution as directed by the device instructions.  · Stand over a sink and  tilt your head sideways over the sink.  · Place the spout of the device in your upper nostril (the one closer to the ceiling).  · Gently pour or squeeze the saline solution into the nasal cavity. The liquid should drain to the lower nostril if you are not overly congested.  · Gently blow your nose. Blowing too hard may cause ear pain.  · Repeat in the other nostril.  · Clean and rinse your device with clean water and then air-dry it.  ARE THERE RISKS OF A SINUS RINSE?   Sinus rinse is generally very safe and effective. However, there are a few risks, which include:   · A burning sensation in the sinuses. This may happen if you do not make the saline solution as directed. Make sure to follow all directions when   making the saline solution.  · Infection from contaminated water. This is rare, but possible.  · Nasal irritation.     This information is not intended to replace advice given to you by your health care provider. Make sure you discuss any questions you have with your health care provider.     Document Released: 07/31/2013 Document Reviewed: 07/31/2013  Elsevier Interactive Patient Education ©2016 [employer]

## 2015-01-06 NOTE — ED Provider Notes (Signed)
CSN: [id number]     Arrival date & time 01/06/15  1654 History  By signing my name below, I, Misty Marks, attest that this documentation has been prepared under the direction and in the presence of Alveta Heimlich, PA-C Electronically Signed: Soijett Marks, ED Scribe. 01/06/2015. 6:27 PM.   Chief Complaint  Patient presents with  . Cough  . Sore Throat   The history is provided by the patient. No language interpreter was used.   Misty Marks is a 34 y.o. female who presents to the Emergency Department complaining of congestion, sinus pressure, sore throat and cough onset 9 days ago. She states that she is having productive cough with thick yellow/brown sputum. Her sore throat is aching and increases with swallowing. Denies difficulty handling secretions, swallowing or breathing. She also reports a hoarse voice x 2 days. She reports maxillary and frontal sinus pain on the right. Reports green nasal discharge. She states that she has tried ibuprofen, BC powder, warm compresses, Mucinex, Nyquil, Vicks Vap-o-Rub, warm saltwater gargle, cough drops, and chloraseptic spray with mild relief for her symptoms. She denies fever, chills, headache, dizziness, ear pain, eye redness, eye discharge, neck pain color change, rash, chest pain, SOB, abdominal pain, nausea, vomiting trouble, and any other symptoms.   Pt PCP: Dr. Georgianne Fick   Past Medical History  Diagnosis Date  . Obesity    Past Surgical History  Procedure Laterality Date  . Cholecystectomy    . Appendectomy     Family History  Problem Relation Age of Onset  . Hypertension Mother    Social History  Substance Use Topics  . Smoking status: Never Smoker   . Smokeless tobacco: Never Used  . Alcohol Use: No   OB History    No data available     Review of Systems  Constitutional: Negative for fever and chills.  HENT: Positive for congestion, sinus pressure, sore throat and voice change. Negative for drooling, ear pain,  rhinorrhea and trouble swallowing.   Eyes: Negative for discharge and redness.  Respiratory: Positive for cough. Negative for shortness of breath.   Cardiovascular: Negative for chest pain.  Gastrointestinal: Negative for nausea, vomiting and abdominal pain.  Musculoskeletal: Negative for myalgias and neck pain.  Skin: Negative for color change and rash.  Neurological: Negative for dizziness, syncope and headaches.  All other systems reviewed and are negative.    Allergies  Review of patient's allergies indicates no known allergies.  Home Medications   Prior to Admission medications   Medication Sig Start Date End Date Taking? Authorizing Provider  Camphor-Eucalyptus-Menthol (VICKS VAPORUB EX) Apply 1 application topically daily.   Yes Historical Provider, MD  ibuprofen (ADVIL,MOTRIN) 200 MG tablet Take 400 mg by mouth every 6 (six) hours as needed for moderate pain.   Yes Historical Provider, MD  menthol-cetylpyridinium (CEPACOL) 3 MG lozenge Take 1 lozenge by mouth as needed for sore throat.   Yes Historical Provider, MD  phenol (CHLORASEPTIC) 1.4 % LIQD Use as directed 1 spray in the mouth or throat as needed for throat irritation / pain.   Yes Historical Provider, MD  Pseudoephedrine-APAP-DM (DAYQUIL PO) Take 30 mLs by mouth daily as needed (cold symptoms).   Yes Historical Provider, MD  pseudoephedrine-guaifenesin (MUCINEX D) 60-600 MG 12 hr tablet Take 1 tablet by mouth 2 (two) times daily as needed for congestion.   Yes Historical Provider, MD  amoxicillin-clavulanate (AUGMENTIN) 875-125 MG tablet Take 1 tablet by mouth 2 (two) times daily. 01/06/15   Rolm Gala  Jeanmarc Viernes, PA-C  cyclobenzaprine (FLEXERIL) 10 MG tablet Take 1 tablet (10 mg total) by mouth 3 (three) times daily as needed for muscle spasms. 05/15/14   Carmelina DaneJeffery S Anderson, MD  ipratropium (ATROVENT) 0.03 % nasal spray Place 2 sprays into both nostrils 2 (two) times daily. Patient not taking: Reported on 05/15/2014 09/25/13    Porfirio Oarhelle Jeffery, PA-C  naproxen sodium (ANAPROX DS) 550 MG tablet Take 1 tablet (550 mg total) by mouth 2 (two) times daily with a meal. 05/15/14 05/15/15  Carmelina DaneJeffery S Anderson, MD  oxyCODONE-acetaminophen (PERCOCET) 5-325 MG per tablet Take 1-2 tablets by mouth every 6 (six) hours as needed for severe pain. 06/28/14   Robyn M Hess, PA-C   BP 128/78 mmHg  Pulse 89  Temp(Src) 97.8 F (36.6 C) (Oral)  Resp 18  SpO2 100%  LMP 12/21/2014 (Approximate) Physical Exam  Constitutional: She appears well-developed and well-nourished. No distress.  Nontoxic appearing  HENT:  Head: Normocephalic and atraumatic.  Right Ear: Tympanic membrane and ear canal normal.  Left Ear: Tympanic membrane and ear canal normal.  Nose: Mucosal edema present. Right sinus exhibits maxillary sinus tenderness and frontal sinus tenderness. Left sinus exhibits no maxillary sinus tenderness and no frontal sinus tenderness.  Mouth/Throat: Uvula is midline and mucous membranes are normal. Mucous membranes are not dry. No uvula swelling. Posterior oropharyngeal erythema present. No posterior oropharyngeal edema or tonsillar abscesses.  Eyes: Conjunctivae are normal. Right eye exhibits no discharge. Left eye exhibits no discharge. No scleral icterus.  Neck: Normal range of motion. Neck supple.  FROM intact. No swelling of the soft tissues of the neck  Cardiovascular: Normal rate, regular rhythm and normal heart sounds.   Pulmonary/Chest: Effort normal and breath sounds normal. No respiratory distress. She has no wheezes. She has no rales.  Abdominal: Soft. There is no tenderness.  Musculoskeletal: Normal range of motion. She exhibits no edema or tenderness.  Moves all extremities spontaneously and without pain  Lymphadenopathy:    She has no cervical adenopathy.  Neurological: She is alert. Coordination normal.  Skin: Skin is warm and dry. She is not diaphoretic.  Psychiatric: She has a normal mood and affect. Her behavior is  normal.  Nursing note and vitals reviewed.   ED Course  Procedures (including critical care time) DIAGNOSTIC STUDIES: Oxygen Saturation is 100% on RA, nl by my interpretation.    COORDINATION OF CARE: 6:25 PM Discussed treatment plan with pt at bedside which includes rapid strep screen and culture, CXR, decadron injection, abx Rx, and continue chloraseptic spray and pt agreed to plan.  Labs Review Labs Reviewed  RAPID STREP SCREEN (NOT [facility])  CULTURE, GROUP A STREP    Imaging Review Dg Chest 2 View  01/06/2015  CLINICAL DATA:  Productive cough with hoarseness EXAM: CHEST  2 VIEW COMPARISON:  None. FINDINGS: Lungs are clear. Heart size and pulmonary vascularity are normal. No adenopathy. No bone lesions. IMPRESSION: No edema or consolidation. Electronically Signed   By: Bretta BangWilliam  Woodruff III M.D.   On: 01/06/2015 18:00   I have personally reviewed and evaluated these images and lab results as part of my medical decision-making.   EKG Interpretation None      MDM   Final diagnoses:  Acute sinusitis, recurrence not specified, unspecified location   Patient presenting with symptoms consistent with sinusitis. Symptoms have been present for 9 days with purulent nasal discharge and maxillary sinus pain. Concern for acute bacterial rhinosinusitis. Patient discharged with Augmentin. Instructions given for warm  saline nasal wash and recommendations for follow-up with primary care physician. Return precautions given in discharge paperwork and discussed with pt at bedside. Pt stable for discharge  I personally performed the services described in this documentation, which was scribed in my presence. The recorded information has been reviewed and is accurate.    Alveta Heimlich, PA-C 01/06/15 2151  Arby Barrette, MD 01/19/15 1640

## 2015-01-09 LAB — CULTURE, GROUP A STREP

## 2016-04-08 ENCOUNTER — Emergency Department (HOSPITAL_BASED_OUTPATIENT_CLINIC_OR_DEPARTMENT_OTHER): Admit: 2016-04-08 | Discharge: 2016-04-08 | Disposition: A | Discharge status: Home / Self Care | Payer: Medicaid Other

## 2016-04-08 ENCOUNTER — Encounter (HOSPITAL_COMMUNITY): Payer: Self-pay | Admitting: Emergency Medicine

## 2016-04-08 ENCOUNTER — Emergency Department (HOSPITAL_COMMUNITY)
Admission: EM | Admit: 2016-04-08 | Discharge: 2016-04-08 | Disposition: A | Discharge status: Home / Self Care | Payer: Medicaid Other | Attending: Emergency Medicine | Admitting: Emergency Medicine

## 2016-04-08 DIAGNOSIS — R202 Paresthesia of skin: Secondary | ICD-10-CM | POA: Diagnosis present

## 2016-04-08 DIAGNOSIS — M79609 Pain in unspecified limb: Secondary | ICD-10-CM

## 2016-04-08 DIAGNOSIS — L989 Disorder of the skin and subcutaneous tissue, unspecified: Secondary | ICD-10-CM | POA: Diagnosis not present

## 2016-04-08 NOTE — Progress Notes (Signed)
*  PRELIMINARY RESULTS* Vascular Ultrasound Left lower extremity venous duplex has been completed.  Preliminary findings: No evidence of DVT or baker's cyst.  Farrel DemarkJill Eunice, RDMS, RVT  04/08/2016, 1:38 PM

## 2016-04-08 NOTE — ED Provider Notes (Signed)
[facility]-EMERGENCY DEPT Provider Note   CSN: [id number] Arrival date & time: 04/08/16  1049   By signing my name below, I, Misty Marks, attest that this documentation has been prepared under the direction and in the presence of Misty RobinsonsJessica Antavius Sperbeck, PA-C Electronically Signed: Soijett Marks, ED Scribe. 04/08/16. 1:57 PM.  History   Chief Complaint Chief Complaint  Patient presents with  . Leg Pain    Left    HPI Misty Marks is a 36 y.o. female who presents to the Emergency Department complaining of sudden onset skin changes onset yesterday afternoon. Pt reports associated  tingling sensation to left foot starting this morning. Pt has not tried any medications for the relief of her symptoms. She denies similar symptoms in the past. Denies swelling, wound, recent injury, recent fall, and any other symptoms. Denies PMHx of HTN or DM.   The history is provided by the patient. No language interpreter was used.    Past Medical History:  Diagnosis Date  . Obesity     Patient Active Problem List   Diagnosis Date Noted  . Obesity     Past Surgical History:  Procedure Laterality Date  . APPENDECTOMY    . CHOLECYSTECTOMY      OB History    No data available       Home Medications    Prior to Admission medications   Medication Sig Start Date End Date Taking? Authorizing Provider  cyclobenzaprine (FLEXERIL) 10 MG tablet Take 1 tablet (10 mg total) by mouth 3 (three) times daily as needed for muscle spasms. 05/15/14  Yes Carmelina DaneJeffery S Anderson, MD  ibuprofen (ADVIL,MOTRIN) 200 MG tablet Take 400 mg by mouth every 6 (six) hours as needed for moderate pain.   Yes Historical Provider, MD  menthol-cetylpyridinium (CEPACOL) 3 MG lozenge Take 1 lozenge by mouth as needed for sore throat.   Yes Historical Provider, MD  oxyCODONE-acetaminophen (PERCOCET) 5-325 MG per tablet Take 1-2 tablets by mouth every 6 (six) hours as needed for severe pain. 06/28/14  Yes Robyn M Hess, PA-C  phenol  (CHLORASEPTIC) 1.4 % LIQD Use as directed 1 spray in the mouth or throat as needed for throat irritation / pain.   Yes Historical Provider, MD  Pseudoephedrine-APAP-DM (DAYQUIL PO) Take 30 mLs by mouth daily as needed (cold symptoms).   Yes Historical Provider, MD  pseudoephedrine-guaifenesin (MUCINEX D) 60-600 MG 12 hr tablet Take 1 tablet by mouth 2 (two) times daily as needed for congestion.   Yes Historical Provider, MD    Family History Family History  Problem Relation Age of Onset  . Hypertension Mother     Social History Social History  Substance Use Topics  . Smoking status: Never Smoker  . Smokeless tobacco: Never Used  . Alcohol use No     Allergies   Patient has no known allergies.   Review of Systems Review of Systems  Constitutional: Negative for chills and fever.  Respiratory: Negative for cough, choking, chest tightness, shortness of breath, wheezing and stridor.   Cardiovascular: Negative for chest pain, palpitations and leg swelling.  Gastrointestinal: Negative for nausea and vomiting.  Musculoskeletal: Negative for joint swelling.       She later reported some cramping in her left calf which has now resolved  Skin: Positive for color change (discoloration to left ankle). Negative for pallor and wound.  Neurological:       +Tingling to left foot     Physical Exam Updated Vital Signs BP (!) 150/95 (BP  Location: Left Arm)   Pulse 86   Temp 98.3 F (36.8 C) (Oral)   Resp 18   Ht 5\' 3"  (1.6 m)   Wt 240 lb (108.9 kg)   LMP 03/17/2016   SpO2 100%   BMI 42.51 kg/m   Physical Exam  Constitutional: She is oriented to person, place, and time. She appears well-developed and well-nourished. No distress.  Patient is afebrile, non-toxic appearing, seating comfortably in chair in no acute distress.  HENT:  Head: Normocephalic and atraumatic.  Eyes: EOM are normal.  Neck: Neck supple.  Cardiovascular: Normal rate.   Pulmonary/Chest: Effort normal. No  respiratory distress.  Abdominal: She exhibits no distension.  Musculoskeletal: Normal range of motion. She exhibits no edema, tenderness or deformity.  Left ankle: Anterior marbling and darkening of skin around anterior ankle. No swelling. Warm to touch. 5/5 strength to BLE.  NVI distally  Neurological: She is alert and oriented to person, place, and time.  Skin: Skin is warm and dry.  Psychiatric: She has a normal mood and affect. Her behavior is normal.  Nursing note and vitals reviewed.    ED Treatments / Results  DIAGNOSTIC STUDIES: Oxygen Saturation is 100% on RA, nl by my interpretation.    COORDINATION OF CARE: 12:19 PM Discussed treatment plan with pt at bedside which includes consult with attending, LE venous US and pt agreed to plan.  Radiology No results found. Vascular Ultrasound Left lower extremity venous duplex has been completed.  Preliminary findings: No evidence of DVT or baker's cyst.  Procedures Procedures (including critical care time)  Medications Ordered in ED Medications - No data to display   Initial Impression / Assessment and Plan / ED Course  I have reviewed the triage vital signs and the nursing notes.  Pertinent imaging results that were available during my care of the patient were reviewed by me and considered in my medical decision making (see chart for details).      Patient presents with irregular skin darkening on the anterior portion of her left ankle since yesterday and pins and needles on the sole of her foot since this morning. She also reports an episode of cramping in her left calf. Denies hx DVT/PE, immobilization, surgery, trauma, estrogen use, swelling, fever, chills or other symptoms. Neurovascularly intact bilaterally. Exam otherwise unremarkable. She is well-appearing and non-toxic.  [place] negative for DVT.  Discharge home with close PCP follow up.  Discussed strict return precautions and advised to return to the emergency  department if experiencing any new or worsening symptoms. Instructions were understood and patient agreed with discharge plan.  Patient was discussed with Dr. Madilyn Hook who has seen patient and agrees with assessment and plan.  Final Clinical Impressions(s) / ED Diagnoses   Final diagnoses:  Tingling sensation  Skin abnormality    New Prescriptions New Prescriptions   No medications on file   *I personally performed the services described in this documentation, which was scribed in my presence. The recorded information has been reviewed and is accurate.     Georgiana Shore, PA-C 04/08/16 1400    Tilden Fossa, MD 04/11/16 1539

## 2016-04-08 NOTE — Discharge Instructions (Signed)
Please follow up with your primary care provider. Return to the emergency department if you experience numbness, swelling in your leg, pain in your calf, shortness of breath, chest pain or any other new concerning symptoms in the meantime.

## 2016-04-08 NOTE — ED Triage Notes (Signed)
Patient reports bruising to left ankle starting yesterday. Reports left leg pain/tingling starting this morning. States she now feels like she is walking on pins and needles. Denies injury/trauma to the extremity.

## 2021-01-12 ENCOUNTER — Other Ambulatory Visit: Payer: Self-pay

## 2021-01-12 ENCOUNTER — Encounter (HOSPITAL_COMMUNITY): Payer: Self-pay

## 2021-01-12 ENCOUNTER — Emergency Department (HOSPITAL_COMMUNITY): Payer: Medicaid Other

## 2021-01-12 ENCOUNTER — Emergency Department (HOSPITAL_COMMUNITY)
Admission: EM | Admit: 2021-01-12 | Discharge: 2021-01-13 | Disposition: A | Discharge status: Home / Self Care | Payer: Medicaid Other | Attending: Emergency Medicine | Admitting: Emergency Medicine

## 2021-01-12 DIAGNOSIS — R39198 Other difficulties with micturition: Secondary | ICD-10-CM | POA: Insufficient documentation

## 2021-01-12 DIAGNOSIS — R1084 Generalized abdominal pain: Secondary | ICD-10-CM

## 2021-01-12 DIAGNOSIS — K5732 Diverticulitis of large intestine without perforation or abscess without bleeding: Secondary | ICD-10-CM | POA: Insufficient documentation

## 2021-01-12 DIAGNOSIS — D72829 Elevated white blood cell count, unspecified: Secondary | ICD-10-CM | POA: Insufficient documentation

## 2021-01-12 DIAGNOSIS — Z9049 Acquired absence of other specified parts of digestive tract: Secondary | ICD-10-CM | POA: Diagnosis not present

## 2021-01-12 DIAGNOSIS — R1031 Right lower quadrant pain: Secondary | ICD-10-CM | POA: Diagnosis present

## 2021-01-12 DIAGNOSIS — K5792 Diverticulitis of intestine, part unspecified, without perforation or abscess without bleeding: Secondary | ICD-10-CM

## 2021-01-12 LAB — CBC WITH DIFFERENTIAL/PLATELET
Abs Immature Granulocytes: 0.14 10*3/uL — ABNORMAL HIGH (ref 0.00–0.07)
Basophils Absolute: 0.1 10*3/uL (ref 0.0–0.1)
Basophils Relative: 0 %
Eosinophils Absolute: 0 10*3/uL (ref 0.0–0.5)
Eosinophils Relative: 0 %
HCT: 41.6 % (ref 36.0–46.0)
Hemoglobin: 13 g/dL (ref 12.0–15.0)
Immature Granulocytes: 1 %
Lymphocytes Relative: 8 %
Lymphs Abs: 1.9 10*3/uL (ref 0.7–4.0)
MCH: 27.4 pg (ref 26.0–34.0)
MCHC: 31.3 g/dL (ref 30.0–36.0)
MCV: 87.8 fL (ref 80.0–100.0)
Monocytes Absolute: 0.8 10*3/uL (ref 0.1–1.0)
Monocytes Relative: 3 %
Neutro Abs: 21.4 10*3/uL — ABNORMAL HIGH (ref 1.7–7.7)
Neutrophils Relative %: 88 %
Platelets: 326 10*3/uL (ref 150–400)
RBC: 4.74 MIL/uL (ref 3.87–5.11)
RDW: 13.9 % (ref 11.5–15.5)
WBC: 24.3 10*3/uL — ABNORMAL HIGH (ref 4.0–10.5)
nRBC: 0 % (ref 0.0–0.2)

## 2021-01-12 LAB — COMPREHENSIVE METABOLIC PANEL
ALT: 43 U/L (ref 0–44)
AST: 41 U/L (ref 15–41)
Albumin: 4 g/dL (ref 3.5–5.0)
Alkaline Phosphatase: 77 U/L (ref 38–126)
Anion gap: 10 (ref 5–15)
BUN: 9 mg/dL (ref 6–20)
CO2: 25 mmol/L (ref 22–32)
Calcium: 9 mg/dL (ref 8.9–10.3)
Chloride: 99 mmol/L (ref 98–111)
Creatinine, Ser: 0.83 mg/dL (ref 0.44–1.00)
GFR, Estimated: >60 mL/min (ref >60)
Glucose, Bld: 98 mg/dL (ref 70–99)
Potassium: 3.5 mmol/L (ref 3.5–5.1)
Sodium: 134 mmol/L — ABNORMAL LOW (ref 135–145)
Total Bilirubin: 0.9 mg/dL (ref 0.3–1.2)
Total Protein: 9.1 g/dL — ABNORMAL HIGH (ref 6.5–8.1)

## 2021-01-12 LAB — URINALYSIS, ROUTINE W REFLEX MICROSCOPIC
Bilirubin Urine: NEGATIVE
Glucose, UA: NEGATIVE mg/dL
Ketones, ur: 80 mg/dL — AB
Leukocytes,Ua: NEGATIVE
Nitrite: NEGATIVE
Protein, ur: 100 mg/dL — AB
Specific Gravity, Urine: 1.026 (ref 1.005–1.030)
pH: 5 (ref 5.0–8.0)

## 2021-01-12 LAB — I-STAT BETA HCG BLOOD, ED (MC, WL, AP ONLY): I-stat hCG, quantitative: <5 m[IU]/mL (ref <5)

## 2021-01-12 LAB — LIPASE, BLOOD: Lipase: 27 U/L (ref 11–51)

## 2021-01-12 LAB — LACTIC ACID, PLASMA: Lactic Acid, Venous: 1.7 mmol/L (ref 0.5–1.9)

## 2021-01-12 MED ORDER — ONDANSETRON 4 MG PO TBDP: 4.0000 mg | ORAL_TABLET | Freq: Three times a day (TID) | ORAL | 0 refills | Status: AC | PRN

## 2021-01-12 MED ORDER — CIPROFLOXACIN HCL 500 MG PO TABS: 500.0000 mg | ORAL_TABLET | Freq: Two times a day (BID) | ORAL | 0 refills | Status: AC

## 2021-01-12 MED ORDER — ONDANSETRON HCL 4 MG/2ML IJ SOLN
4.0000 mg | Freq: Once | INTRAMUSCULAR | Status: AC
  Administered 2021-01-12: 18:00:00 4 mg via INTRAVENOUS
  Filled 2021-01-12: qty 2

## 2021-01-12 MED ORDER — SODIUM CHLORIDE 0.9 % IV SOLN
2.0000 g | Freq: Once | INTRAVENOUS | Status: AC
  Administered 2021-01-12: 22:00:00 2 g via INTRAVENOUS
  Filled 2021-01-12: qty 20

## 2021-01-12 MED ORDER — HYDROMORPHONE HCL 1 MG/ML IJ SOLN
0.5000 mg | Freq: Once | INTRAMUSCULAR | Status: AC
  Administered 2021-01-12: 18:00:00 0.5 mg via INTRAVENOUS
  Filled 2021-01-12: qty 1

## 2021-01-12 MED ORDER — METRONIDAZOLE 500 MG/100ML IV SOLN
500.0000 mg | Freq: Once | INTRAVENOUS | Status: AC
  Administered 2021-01-12: 22:00:00 500 mg via INTRAVENOUS
  Filled 2021-01-12: qty 100

## 2021-01-12 MED ORDER — IOHEXOL 350 MG/ML SOLN
80.0000 mL | Freq: Once | INTRAVENOUS | Status: AC | PRN
  Administered 2021-01-12: 19:00:00 80 mL via INTRAVENOUS

## 2021-01-12 MED ORDER — SODIUM CHLORIDE 0.9 % IV BOLUS
1000.0000 mL | Freq: Once | INTRAVENOUS | Status: AC
  Administered 2021-01-12: 18:00:00 1000 mL via INTRAVENOUS

## 2021-01-12 MED ORDER — METRONIDAZOLE 500 MG PO TABS: 500.0000 mg | ORAL_TABLET | Freq: Three times a day (TID) | ORAL | 0 refills | Status: AC

## 2021-01-12 MED ORDER — OXYCODONE-ACETAMINOPHEN 5-325 MG PO TABS
1.0000 | ORAL_TABLET | Freq: Once | ORAL | Status: AC
  Administered 2021-01-12: 23:00:00 1 via ORAL
  Filled 2021-01-12: qty 1

## 2021-01-12 MED ORDER — ONDANSETRON 4 MG PO TBDP
4.0000 mg | ORAL_TABLET | Freq: Once | ORAL | Status: AC
  Administered 2021-01-12: 23:00:00 4 mg via ORAL
  Filled 2021-01-12: qty 1

## 2021-01-12 MED ORDER — OXYCODONE-ACETAMINOPHEN 5-325 MG PO TABS: 1.0000 | ORAL_TABLET | Freq: Four times a day (QID) | ORAL | 0 refills | Status: AC | PRN

## 2021-01-12 NOTE — ED Triage Notes (Signed)
Pt presents with c/o lower right quadrant abdominal pain since Christmas Day. Pt also c/o nausea and constipation with the pain.

## 2021-01-12 NOTE — ED Provider Notes (Signed)
Emergency Medicine Provider Triage Evaluation Note  Misty Marks , a 40 y.o. female  was evaluated in triage.  Pt complains of abd pain for 3 days. No medical problems. Has had appendix and GB removed + hernia surgery. No vomiting or diarrhea. Some nausea.   Review of Systems  Positive: Abd pain Negative: Fever   Physical Exam  BP (!) 135/96 (BP Location: Right Arm)    Pulse 87    Temp 98.5 F (36.9 C) (Oral)    Resp 20    LMP 12/16/2020 (Approximate)    SpO2 98%  Gen:   Awake, no distress   Resp:  Normal effort  MSK:   Moves extremities without difficulty  Other:  Palpable mass to abd above umbilicus. TTP of RUQ.  Medical Decision Making  Medically screening exam initiated at 3:43 PM.  Appropriate orders placed.  Misty Marks was informed that the remainder of the evaluation will be completed by another provider, this initial triage assessment does not replace that evaluation, and the importance of remaining in the ED until their evaluation is complete.  Pt exquisitely TTP  CT AP ordered.    Solon [place], [place] 01/12/21 1545    Pricilla Loveless, MD 01/12/21 1610

## 2021-01-12 NOTE — ED Provider Notes (Signed)
[facility] DEPT Provider Note   CSN: [id number] Arrival date & time: 01/12/21  1354     History Chief Complaint  Patient presents with   Abdominal Pain    Misty Marks is a 40 y.o. female.  Patient with history of appendectomy as a teenager, cholecystectomy and hernia repair several years ago --presents to the emergency department for acute onset of gradually worsening abdominal pain.  Patient states that she began having pain in her abdomen around 2 PM on Christmas Day.  This has gradually progressed and worsened.  She has had chills at times and a low-grade temperature.  Temperature of 100.0 F on my exam.  She has had chills.  She reports no solid oral intake over the past 2 days because this makes the pain worse.  Also, it is very difficult for her to walk and move around because this makes the pain worse.  Decreased urination without dysuria.  No vomiting but she has been nauseous.  Pain started around the umbilicus, now generalized but worse on the right side.       Past Medical History:  Diagnosis Date   Obesity     Patient Active Problem List   Diagnosis Date Noted   Obesity     Past Surgical History:  Procedure Laterality Date   APPENDECTOMY     CHOLECYSTECTOMY       OB History   No obstetric history on file.     Family History  Problem Relation Age of Onset   Hypertension Mother     Social History   Tobacco Use   Smoking status: Never   Smokeless tobacco: Never  Substance Use Topics   Alcohol use: No   Drug use: No    Home Medications Prior to Admission medications   Medication Sig Start Date End Date Taking? Authorizing Provider  cyclobenzaprine (FLEXERIL) 10 MG tablet Take 1 tablet (10 mg total) by mouth 3 (three) times daily as needed for muscle spasms. 05/15/14   Roselee Culver, MD  ibuprofen (ADVIL,MOTRIN) 200 MG tablet Take 400 mg by mouth every 6 (six) hours as needed for moderate pain.     [provider]  menthol-cetylpyridinium (CEPACOL) 3 MG lozenge Take 1 lozenge by mouth as needed for sore throat.    [provider]  oxyCODONE-acetaminophen (PERCOCET) 5-325 MG per tablet Take 1-2 tablets by mouth every 6 (six) hours as needed for severe pain. 06/28/14   Hess, Hessie Diener, PA-C  phenol (CHLORASEPTIC) 1.4 % LIQD Use as directed 1 spray in the mouth or throat as needed for throat irritation / pain.    [provider]  Pseudoephedrine-APAP-DM (DAYQUIL PO) Take 30 mLs by mouth daily as needed (cold symptoms).    [provider]  pseudoephedrine-guaifenesin (MUCINEX D) 60-600 MG 12 hr tablet Take 1 tablet by mouth 2 (two) times daily as needed for congestion.    [provider]    Allergies    Patient has no known allergies.  Review of Systems   Review of Systems  Constitutional:  Positive for chills and fever.  HENT:  Negative for rhinorrhea and sore throat.   Eyes:  Negative for redness.  Respiratory:  Negative for cough.   Cardiovascular:  Negative for chest pain.  Gastrointestinal:  Positive for abdominal pain and nausea. Negative for diarrhea and vomiting.  Genitourinary:  Negative for dysuria, frequency, hematuria and urgency.  Musculoskeletal:  Negative for myalgias.  Skin:  Negative for rash.  Neurological:  Negative for headaches.   Physical Exam Updated Vital Signs BP (!) 135/96 (BP Location: Right Arm)    Pulse 87    Temp 98.5 F (36.9 C) (Oral)    Resp 20    LMP 12/16/2020 (Approximate)    SpO2 98%   Physical Exam Vitals and nursing note reviewed.  Constitutional:      General: She is not in acute distress.    Appearance: She is well-developed.  HENT:     Head: Normocephalic and atraumatic.     Right Ear: External ear normal.     Left Ear: External ear normal.     Nose: Nose normal.  Eyes:     Conjunctiva/sclera: Conjunctivae normal.  Cardiovascular:     Rate and Rhythm: Normal rate and regular rhythm.      Heart sounds: No murmur heard. Pulmonary:     Effort: No respiratory distress.     Breath sounds: No wheezing, rhonchi or rales.  Abdominal:     Palpations: Abdomen is soft.     Tenderness: There is generalized abdominal tenderness (moderate). There is guarding. There is no rebound. Negative signs include Rovsing's sign.  Musculoskeletal:     Cervical back: Normal range of motion and neck supple.     Right lower leg: No edema.     Left lower leg: No edema.  Skin:    General: Skin is warm and dry.     Findings: No rash.  Neurological:     General: No focal deficit present.     Mental Status: She is alert. Mental status is at baseline.     Motor: No weakness.  Psychiatric:        Mood and Affect: Mood normal.    ED Results / Procedures / Treatments   Labs (all labs ordered are listed, but only abnormal results are displayed) Labs Reviewed  CBC WITH DIFFERENTIAL/PLATELET - Abnormal; Notable for the following components:      Result Value   WBC 24.3 (*)    Neutro Abs 21.4 (*)    Abs Immature Granulocytes 0.14 (*)    All other components within normal limits  COMPREHENSIVE METABOLIC PANEL - Abnormal; Notable for the following components:   Sodium 134 (*)    Total Protein 9.1 (*)    All other components within normal limits  URINALYSIS, ROUTINE W REFLEX MICROSCOPIC - Abnormal; Notable for the following components:   Color, Urine AMBER (*)    APPearance CLOUDY (*)    Hgb urine dipstick MODERATE (*)    Ketones, ur 80 (*)    Protein, ur 100 (*)    Bacteria, UA RARE (*)    All other components within normal limits  LIPASE, BLOOD  LACTIC ACID, PLASMA  I-STAT BETA HCG BLOOD, ED ([facility], [facility], [facility] ONLY)    EKG None  Radiology No results found.  Procedures Procedures   Medications Ordered in ED Medications  HYDROmorphone (DILAUDID) injection 0.5 mg (has no administration in time range)  ondansetron (ZOFRAN) injection 4 mg (has no administration in time range)  sodium chloride  0.9 % bolus 1,000 mL (has no administration in time range)    ED Course  I have reviewed the triage vital signs and the nursing notes.  Pertinent labs & imaging results that were available during my care of the patient were reviewed by me and considered in my medical decision making (see chart for details).  Patient seen and examined. Work-up initiated. Medications ordered.   Vital signs  reviewed and are as follows: BP (!) 135/96 (BP Location: Right Arm)    Pulse 87    Temp 98.5 F (36.9 C) (Oral)    Resp 20    LMP 12/16/2020 (Approximate)    SpO2 98%   8:14 PM on reexam, patient appears more comfortable.  Recheck of temperature 98.4 F.  Updated patient on CT results.  We will give a dose of IV antibiotics.  If patient does well, pain remains controlled, consider discharge to home with oral treatment.  If pain is uncontrolled or patient develops a high fever, will likely need admission for IV antibiotics.  Will reassess.  Patient in agreement.  BP (!) 135/96 (BP Location: Right Arm)    Pulse (!) 110    Temp 98.5 F (36.9 C) (Oral)    Resp 18    LMP 12/16/2020 (Approximate)    SpO2 100%   10:55 PM patient continues to do well.  She has received Rocephin and Flagyl IV.  She looks reasonably well.  On recheck, temperature 98.6 F.  Current plan: Discharged home.  Prescription for Percocet, Zofran, Cipro, Flagyl.  I encouraged her to follow-up with her primary care doctor in 48 hours for recheck.  Discussed that she may need GI follow-up potentially.  We discussed signs symptoms to return including uncontrolled pain, persistent vomiting, high persistent fever.  Patient counseled on use of narcotic pain medications. Counseled not to combine these medications with others containing tylenol. Urged not to drink alcohol, drive, or perform any other activities that requires focus while taking these medications. The patient verbalizes understanding and agrees with the plan.     MDM  Rules/Calculators/A&P                          Patient with abdominal pain. Vitals are stable, borderline fever on arrival but no fever during evaluation and treatment.  Labs elevated WBC, urine not a clean-catch but low concern for UTI.  Normal lactate.  Imaging demonstrates diverticulitis, no complications from her ventral hernia. No signs of dehydration, patient is tolerating PO's. Lungs are clear and no signs suggestive of PNA. Low concern for appendicitis, cholecystitis, pancreatitis, ruptured viscus, UTI, kidney stone, aortic dissection, aortic aneurysm or other emergent abdominal etiology. Supportive therapy indicated with return if symptoms worsen.  She will require close PCP follow-up to monitor for improvement.     Final Clinical Impression(s) / ED Diagnoses Final diagnoses:  Diverticulitis  Generalized abdominal pain    Rx / DC Orders ED Discharge Orders          Ordered    ciprofloxacin (CIPRO) 500 MG tablet  2 times daily        01/12/21 2254    metroNIDAZOLE (FLAGYL) 500 MG tablet  3 times daily        01/12/21 2254    oxyCODONE-acetaminophen (PERCOCET/ROXICET) 5-325 MG tablet  Every 6 hours PRN        01/12/21 2254    ondansetron (ZOFRAN-ODT) 4 MG disintegrating tablet  Every 8 hours PRN        01/12/21 2254             Renne Crigler, PA-C 01/12/21 2259    Cathren Laine, MD 01/15/21 1902

## 2021-01-12 NOTE — Discharge Instructions (Signed)
Please read and follow all provided instructions.  Your diagnoses today include:  1. Diverticulitis   2. Generalized abdominal pain     Tests performed today include: Blood cell counts and platelets: very high white blood cell count Kidney and liver function tests Pancreas function test (called lipase) Urine test to look for infection A blood or urine test for pregnancy (women only) CT scan of the abdomen/pelvis: Shows signs of diverticulitis Vital signs. See below for your results today.   Medications prescribed:  Percocet (oxycodone/acetaminophen) - narcotic pain medication  DO NOT drive or perform any activities that require you to be awake and alert because this medicine can make you drowsy. BE VERY CAREFUL not to take multiple medicines containing Tylenol (also called acetaminophen). Doing so can lead to an overdose which can damage your liver and cause liver failure and possibly death.  Zofran (ondansetron) - for nausea and vomiting  Ciprofloxacin - antibiotic  You have been prescribed an antibiotic medicine: take the entire course of medicine even if you are feeling better. Stopping early can cause the antibiotic not to work.  Metronidazole - antibiotic  You have been prescribed an antibiotic medicine: take the entire course of medicine even if you are feeling better. Stopping early can cause the antibiotic not to work. Do not drink alcohol when taking this medication.   Take any prescribed medications only as directed.  Home care instructions:  Follow any educational materials contained in this packet.  Follow-up instructions: Please follow-up with your primary care provider in the next 2 days for further evaluation of your symptoms.    Return instructions:  SEEK IMMEDIATE MEDICAL ATTENTION IF: The pain does not go away or becomes severe  A temperature above 101F develops  Repeated vomiting occurs (multiple episodes)  The pain becomes localized to portions of the  abdomen. The right side could possibly be appendicitis. In an adult, the left lower portion of the abdomen could be colitis or diverticulitis.  Blood is being passed in stools or vomit (bright red or black tarry stools)  You develop chest pain, difficulty breathing, dizziness or fainting, or become confused, poorly responsive, or inconsolable (young children) If you have any other emergent concerns regarding your health  Additional Information: Abdominal (belly) pain can be caused by many things. Your caregiver performed an examination and possibly ordered blood/urine tests and imaging (CT scan, x-rays, ultrasound). Many cases can be observed and treated at home after initial evaluation in the emergency department. Even though you are being discharged home, abdominal pain can be unpredictable. Therefore, you need a repeated exam if your pain does not resolve, returns, or worsens. Most patients with abdominal pain don't have to be admitted to the hospital or have surgery, but serious problems like appendicitis and gallbladder attacks can start out as nonspecific pain. Many abdominal conditions cannot be diagnosed in one visit, so follow-up evaluations are very important.  Your vital signs today were: BP 126/82 (BP Location: Left Arm)    Pulse 99    Temp 98.5 F (36.9 C) (Oral)    Resp 14    LMP 12/16/2020 (Approximate)    SpO2 95%  If your blood pressure (bp) was elevated above 135/85 this visit, please have this repeated by your doctor within one month. --------------

## 2022-01-27 IMAGING — CT CT ABD-PELV W/ CM
2 of 5 series · 16 of 46 positions shown, 18 images · IV contrast (omnipaque)
Comparison: 04/08/2008 CT abdomen

CLINICAL DATA: Acute abdominal pain

EXAM:
CT ABDOMEN AND PELVIS WITH CONTRAST
TECHNIQUE: Multidetector CT imaging of the abdomen and pelvis was performed
using the standard protocol following bolus administration of
intravenous contrast.
CONTRAST:  80mL OMNIPAQUE IOHEXOL 350 MG/ML SOLN

[Series 2: axial st · axial · 0.83mm/px · z∈[+1132,+1527]mm · 13 of 93 slices shown, 15 images]
[im 7/93  soft-tissue]
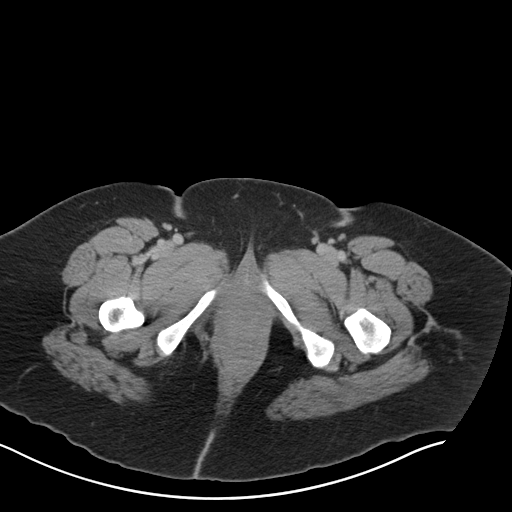
[im 7/93  bone]
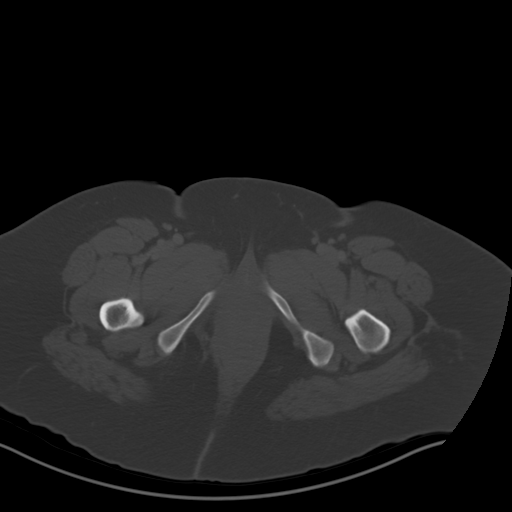
[im 14/93  soft-tissue]
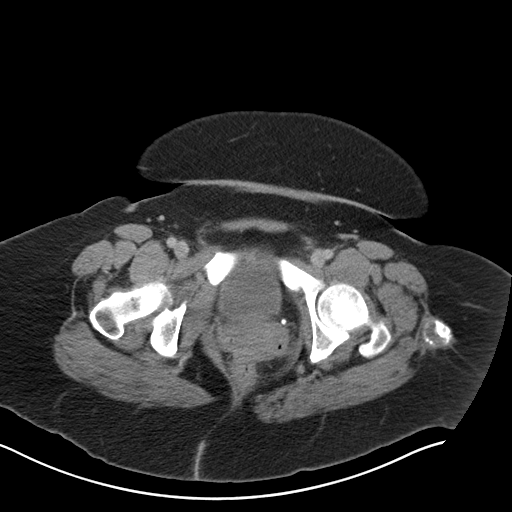
[im 20/93  soft-tissue]
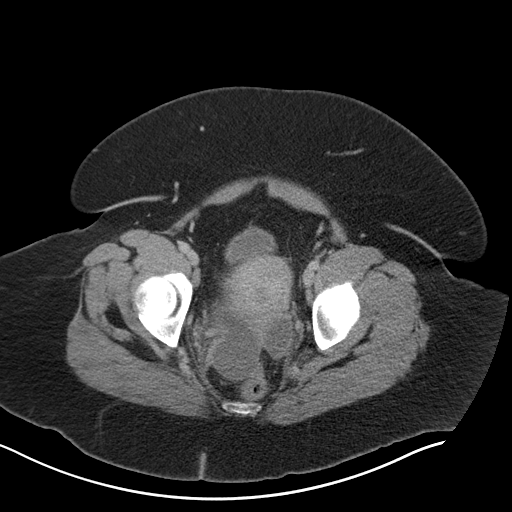
[im 27/93  soft-tissue]
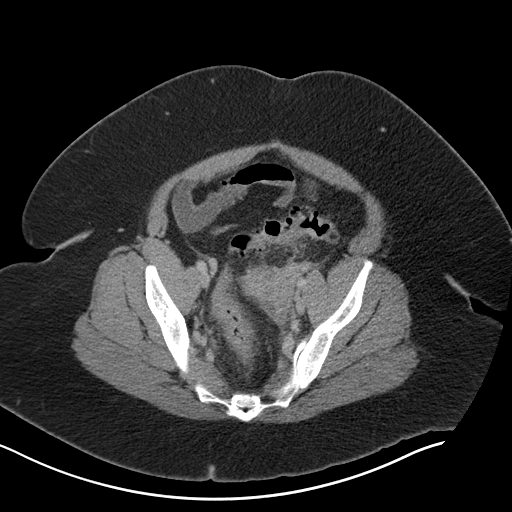
[im 33/93  soft-tissue]
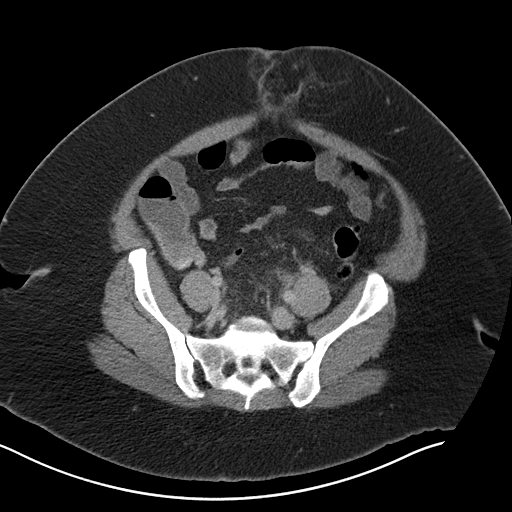
[im 40/93  soft-tissue]
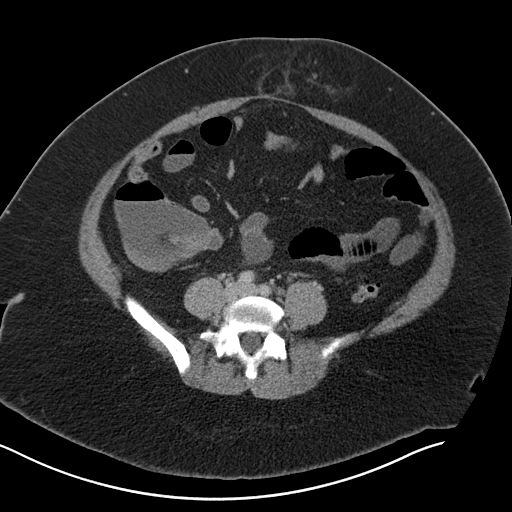
[im 47/93  soft-tissue]
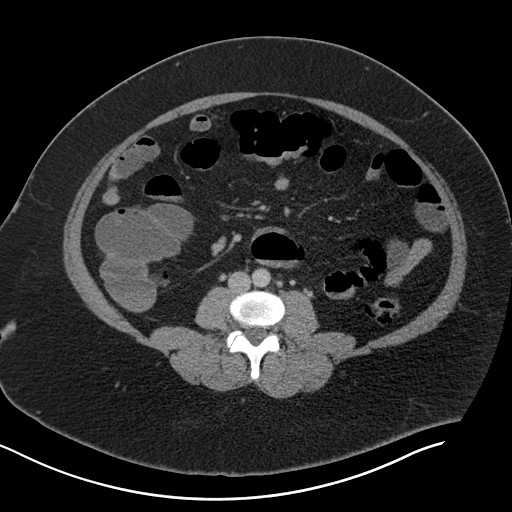
[im 53/93  soft-tissue]
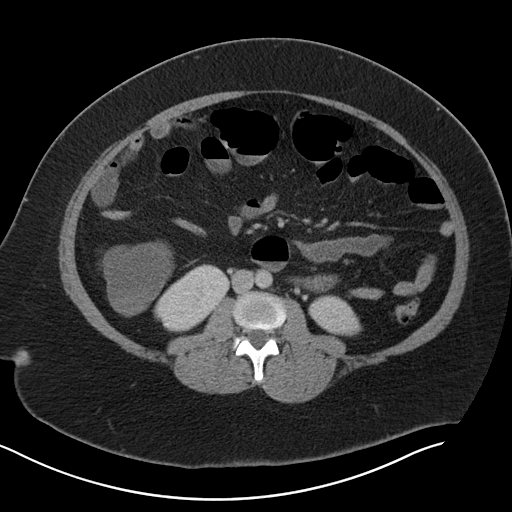
[im 60/93  soft-tissue]
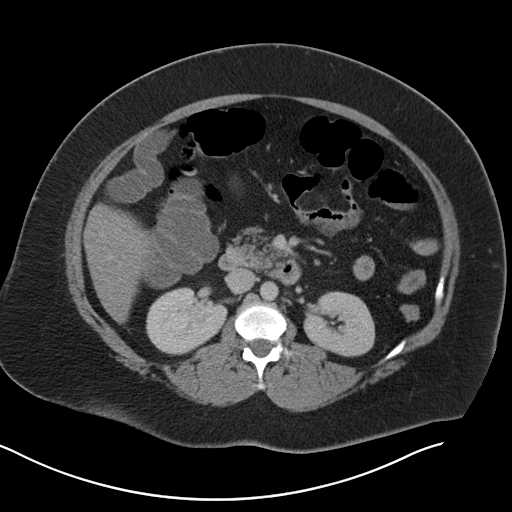
[im 60/93  bone]
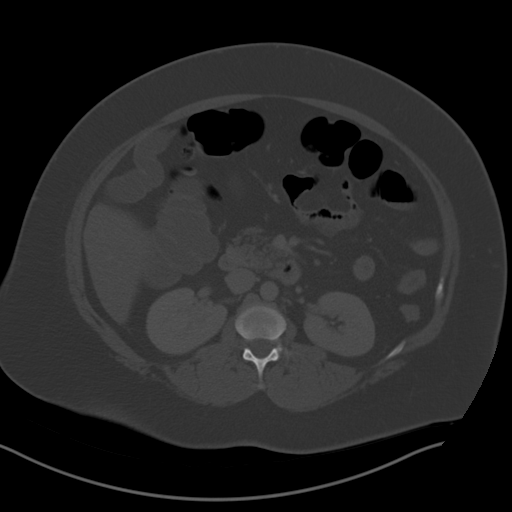
[im 66/93  soft-tissue]
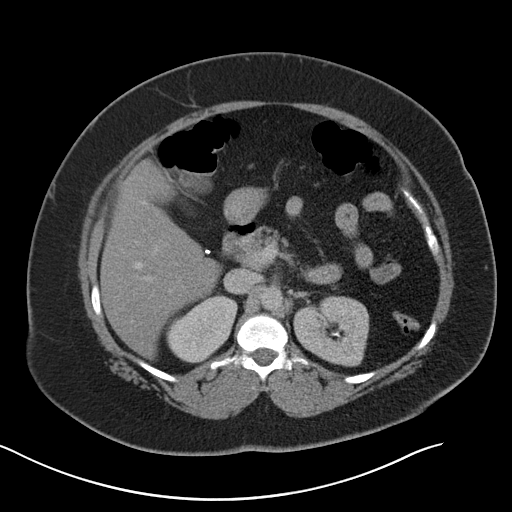
[im 73/93  soft-tissue]
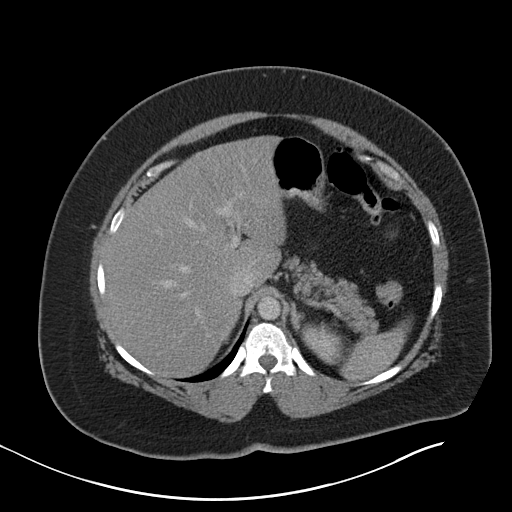
[im 79/93  soft-tissue]
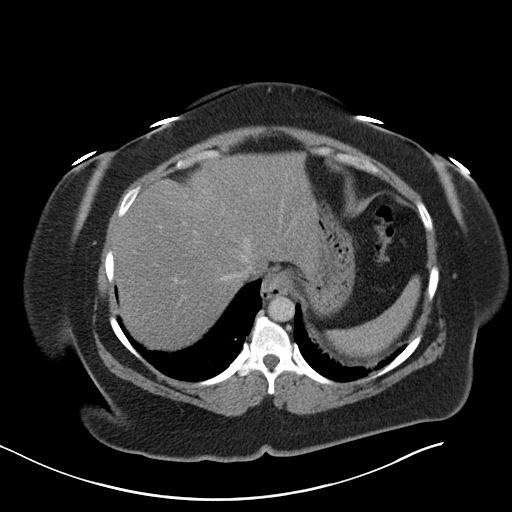
[im 86/93  soft-tissue]
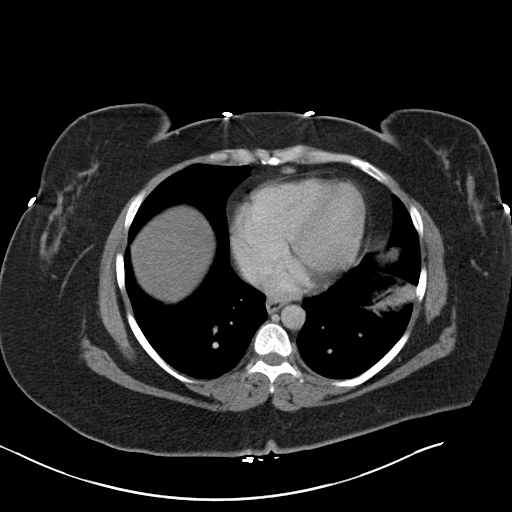

[Series 5: coronal st · coronal · 0.86mm/px · 3 of 182 slices shown]
[im 61/182  soft-tissue]
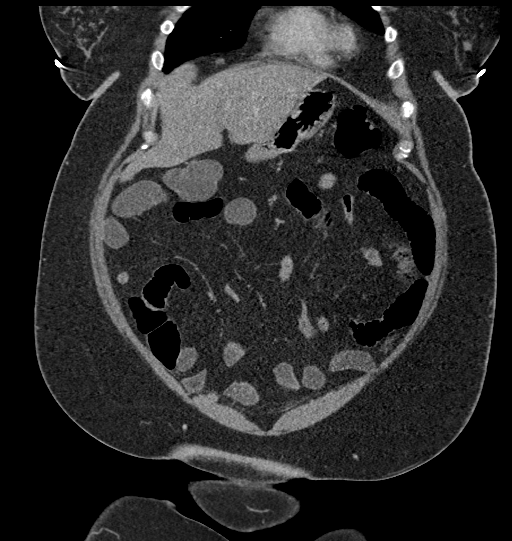
[im 81/182  soft-tissue]
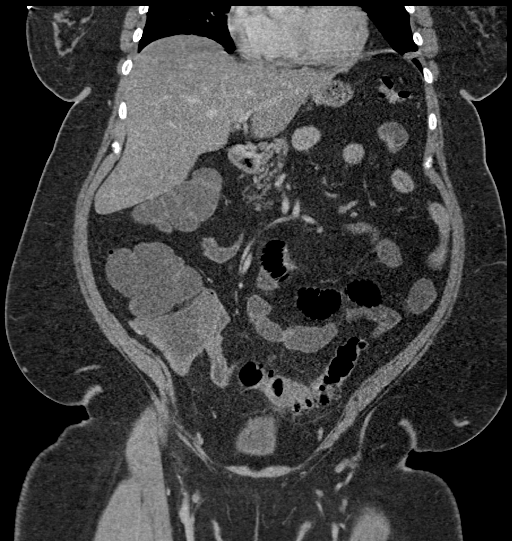
[im 101/182  soft-tissue]
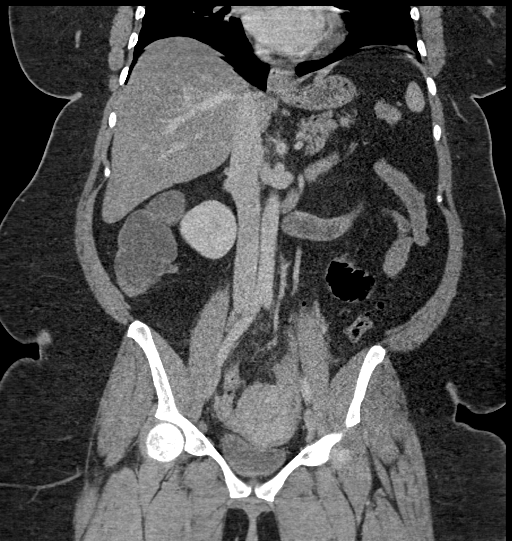

[16 of 46 positions shown; findings below may reference images not displayed]

FINDINGS: LOWER CHEST: Atelectasis in the lingula.

HEPATOBILIARY: Normal hepatic contours. No intra- or extrahepatic
biliary dilatation. Status post cholecystectomy.

PANCREAS: Normal pancreas. No ductal dilatation or peripancreatic
fluid collection.

SPLEEN: Normal.

ADRENALS/URINARY TRACT: The adrenal glands are normal. Multiple
bilateral nonobstructing renal calculi measuring 2-3 mm. No
hydronephrosis. The urinary bladder is normal for degree of
distention

STOMACH/BOWEL: There is no hiatal hernia. Normal duodenal course and
caliber. No small bowel dilatation or inflammation. There is
rectosigmoid diverticulosis with mild inflammatory change of the
distal sigmoid colon and proximal rectum. No free intraperitoneal
air or fluid collection. Normal appendix.

VASCULAR/LYMPHATIC: Normal course and caliber of the major abdominal
vessels. No abdominal or pelvic lymphadenopathy.

REPRODUCTIVE: Normal uterus. No adnexal mass.

MUSCULOSKELETAL. No bony spinal canal stenosis or focal osseous
abnormality.

OTHER: Fat containing ventral abdominal hernia has increased in size
since 04/08/2008
IMPRESSION: 1. Mild inflammatory change of the distal sigmoid colon and proximal
rectum, consistent with acute diverticulitis.
2. Bilateral nonobstructive nephrolithiasis.
3. Increased size of fat containing ventral abdominal hernia.

## 2023-10-05 ENCOUNTER — Encounter: Payer: Self-pay | Admitting: Gastroenterology

## 2023-10-05 ENCOUNTER — Other Ambulatory Visit: Payer: Self-pay | Admitting: Gastroenterology

## 2023-10-05 DIAGNOSIS — R935 Abnormal findings on diagnostic imaging of other abdominal regions, including retroperitoneum: Secondary | ICD-10-CM

## 2023-10-05 DIAGNOSIS — R1310 Dysphagia, unspecified: Secondary | ICD-10-CM

## 2023-10-12 ENCOUNTER — Inpatient Hospital Stay
Admission: RE | Admit: 2023-10-12 | Discharge: 2023-10-12 | Discharge status: Home / Self Care | Source: Ambulatory Visit | Attending: Gastroenterology | Admitting: Gastroenterology

## 2023-10-12 DIAGNOSIS — R1310 Dysphagia, unspecified: Secondary | ICD-10-CM

## 2023-10-13 ENCOUNTER — Inpatient Hospital Stay: Admission: RE | Admit: 2023-10-13 | Source: Ambulatory Visit

## 2023-10-17 ENCOUNTER — Ambulatory Visit
Admission: RE | Admit: 2023-10-17 | Discharge: 2023-10-17 | Disposition: A | Discharge status: Home / Self Care | Source: Ambulatory Visit | Attending: Gastroenterology | Admitting: Gastroenterology

## 2023-10-17 DIAGNOSIS — R935 Abnormal findings on diagnostic imaging of other abdominal regions, including retroperitoneum: Secondary | ICD-10-CM

## 2023-10-17 MED ORDER — IOPAMIDOL (ISOVUE-300) INJECTION 61%
100.0000 mL | Freq: Once | INTRAVENOUS | Status: AC | PRN
  Administered 2023-10-17: 100 mL via INTRAVENOUS
